# Patient Record
Sex: Male | Born: 1945 | Race: White | Hispanic: No | Marital: Married | Smoking: Former smoker
Health system: Southern US, Community
[De-identification: ages and names within clinical notes are randomized; demographics above are authoritative.]

## PROBLEM LIST (undated history)

## (undated) DIAGNOSIS — K635 Polyp of colon: Secondary | ICD-10-CM

## (undated) DIAGNOSIS — L409 Psoriasis, unspecified: Secondary | ICD-10-CM

## (undated) DIAGNOSIS — I420 Dilated cardiomyopathy: Secondary | ICD-10-CM

## (undated) DIAGNOSIS — I480 Paroxysmal atrial fibrillation: Secondary | ICD-10-CM

## (undated) DIAGNOSIS — Z7901 Long term (current) use of anticoagulants: Secondary | ICD-10-CM

## (undated) DIAGNOSIS — F17201 Nicotine dependence, unspecified, in remission: Secondary | ICD-10-CM

## (undated) DIAGNOSIS — I10 Essential (primary) hypertension: Secondary | ICD-10-CM

## (undated) DIAGNOSIS — K2970 Gastritis, unspecified, without bleeding: Secondary | ICD-10-CM

## (undated) DIAGNOSIS — T783XXA Angioneurotic edema, initial encounter: Secondary | ICD-10-CM

## (undated) HISTORY — DX: Dilated cardiomyopathy: I42.0

## (undated) HISTORY — DX: Nicotine dependence, unspecified, in remission: F17.201

## (undated) HISTORY — DX: Long term (current) use of anticoagulants: Z79.01

## (undated) HISTORY — DX: Paroxysmal atrial fibrillation: I48.0

## (undated) HISTORY — DX: Gastritis, unspecified, without bleeding: K29.70

## (undated) HISTORY — DX: Polyp of colon: K63.5

## (undated) HISTORY — DX: Angioneurotic edema, initial encounter: T78.3XXA

## (undated) HISTORY — DX: Essential (primary) hypertension: I10

## (undated) HISTORY — DX: Psoriasis, unspecified: L40.9

---

## 2002-08-02 ENCOUNTER — Encounter: Payer: Self-pay | Admitting: Family Medicine

## 2002-08-02 ENCOUNTER — Ambulatory Visit (HOSPITAL_COMMUNITY): Admission: RE | Admit: 2002-08-02 | Discharge: 2002-08-02 | Payer: Self-pay | Admitting: Family Medicine

## 2002-08-30 ENCOUNTER — Ambulatory Visit (HOSPITAL_COMMUNITY): Admission: RE | Admit: 2002-08-30 | Discharge: 2002-08-30 | Payer: Self-pay | Admitting: Internal Medicine

## 2003-11-04 ENCOUNTER — Inpatient Hospital Stay (HOSPITAL_COMMUNITY): Admission: EM | Admit: 2003-11-04 | Discharge: 2003-11-05 | Payer: Self-pay | Admitting: *Deleted

## 2003-11-07 ENCOUNTER — Ambulatory Visit (HOSPITAL_COMMUNITY): Admission: RE | Admit: 2003-11-07 | Discharge: 2003-11-07 | Payer: Self-pay | Admitting: *Deleted

## 2003-11-21 ENCOUNTER — Ambulatory Visit: Payer: Self-pay

## 2005-01-11 ENCOUNTER — Emergency Department (HOSPITAL_COMMUNITY): Admission: EM | Admit: 2005-01-11 | Discharge: 2005-01-11 | Payer: Self-pay | Admitting: Emergency Medicine

## 2005-01-12 DIAGNOSIS — I480 Paroxysmal atrial fibrillation: Secondary | ICD-10-CM

## 2005-01-12 HISTORY — DX: Paroxysmal atrial fibrillation: I48.0

## 2005-04-23 ENCOUNTER — Ambulatory Visit: Payer: Self-pay | Admitting: Cardiology

## 2005-04-23 ENCOUNTER — Inpatient Hospital Stay (HOSPITAL_COMMUNITY): Admission: EM | Admit: 2005-04-23 | Discharge: 2005-04-28 | Payer: Self-pay | Admitting: Emergency Medicine

## 2005-04-27 ENCOUNTER — Encounter: Payer: Self-pay | Admitting: Cardiology

## 2005-04-29 ENCOUNTER — Ambulatory Visit: Payer: Self-pay | Admitting: *Deleted

## 2005-05-06 ENCOUNTER — Ambulatory Visit: Payer: Self-pay | Admitting: *Deleted

## 2005-05-11 ENCOUNTER — Ambulatory Visit: Payer: Self-pay | Admitting: Cardiology

## 2005-05-13 ENCOUNTER — Ambulatory Visit: Payer: Self-pay | Admitting: Cardiology

## 2005-05-15 ENCOUNTER — Ambulatory Visit (HOSPITAL_COMMUNITY): Admission: RE | Admit: 2005-05-15 | Discharge: 2005-05-16 | Payer: Self-pay | Admitting: Internal Medicine

## 2005-05-15 ENCOUNTER — Ambulatory Visit: Payer: Self-pay | Admitting: Internal Medicine

## 2005-05-18 ENCOUNTER — Ambulatory Visit: Payer: Self-pay | Admitting: Cardiology

## 2005-05-29 ENCOUNTER — Ambulatory Visit: Payer: Self-pay | Admitting: *Deleted

## 2005-06-01 ENCOUNTER — Ambulatory Visit: Payer: Self-pay | Admitting: *Deleted

## 2005-06-09 ENCOUNTER — Ambulatory Visit: Payer: Self-pay | Admitting: *Deleted

## 2005-06-12 ENCOUNTER — Ambulatory Visit (HOSPITAL_COMMUNITY): Admission: RE | Admit: 2005-06-12 | Discharge: 2005-06-12 | Payer: Self-pay | Admitting: General Surgery

## 2005-06-12 HISTORY — PX: INGUINAL HERNIA REPAIR: SHX194

## 2005-06-17 ENCOUNTER — Ambulatory Visit (HOSPITAL_COMMUNITY): Admission: RE | Admit: 2005-06-17 | Discharge: 2005-06-17 | Payer: Self-pay | Admitting: General Surgery

## 2005-06-29 ENCOUNTER — Ambulatory Visit: Payer: Self-pay | Admitting: Cardiology

## 2005-07-14 ENCOUNTER — Ambulatory Visit (HOSPITAL_COMMUNITY): Admission: RE | Admit: 2005-07-14 | Discharge: 2005-07-14 | Payer: Self-pay | Admitting: Cardiology

## 2005-07-14 ENCOUNTER — Ambulatory Visit: Payer: Self-pay | Admitting: *Deleted

## 2005-07-31 ENCOUNTER — Ambulatory Visit: Payer: Self-pay | Admitting: *Deleted

## 2005-09-25 ENCOUNTER — Ambulatory Visit: Payer: Self-pay | Admitting: Cardiology

## 2007-01-13 HISTORY — PX: COLONOSCOPY W/ POLYPECTOMY: SHX1380

## 2007-05-11 ENCOUNTER — Observation Stay (HOSPITAL_COMMUNITY): Admission: EM | Admit: 2007-05-11 | Discharge: 2007-05-12 | Payer: Self-pay | Admitting: Emergency Medicine

## 2007-12-17 ENCOUNTER — Emergency Department (HOSPITAL_COMMUNITY): Admission: EM | Admit: 2007-12-17 | Discharge: 2007-12-17 | Payer: Self-pay | Admitting: Emergency Medicine

## 2008-09-18 DIAGNOSIS — D126 Benign neoplasm of colon, unspecified: Secondary | ICD-10-CM

## 2009-07-19 ENCOUNTER — Ambulatory Visit (HOSPITAL_COMMUNITY): Admission: RE | Admit: 2009-07-19 | Discharge: 2009-07-19 | Payer: Self-pay | Admitting: Family Medicine

## 2009-07-21 ENCOUNTER — Inpatient Hospital Stay (HOSPITAL_COMMUNITY): Admission: EM | Admit: 2009-07-21 | Discharge: 2009-08-03 | Payer: Self-pay | Admitting: Emergency Medicine

## 2009-07-21 ENCOUNTER — Ambulatory Visit: Payer: Self-pay | Admitting: Cardiology

## 2009-07-22 ENCOUNTER — Encounter (INDEPENDENT_AMBULATORY_CARE_PROVIDER_SITE_OTHER): Payer: Self-pay | Admitting: *Deleted

## 2009-07-22 LAB — CONVERTED CEMR LAB: TSH: 4.3 microintl units/mL

## 2009-07-23 ENCOUNTER — Encounter: Payer: Self-pay | Admitting: Cardiology

## 2009-07-26 ENCOUNTER — Ambulatory Visit: Payer: Self-pay | Admitting: Cardiology

## 2009-07-26 ENCOUNTER — Other Ambulatory Visit: Payer: Self-pay

## 2009-07-27 ENCOUNTER — Other Ambulatory Visit: Payer: Self-pay | Admitting: Cardiology

## 2009-07-28 ENCOUNTER — Other Ambulatory Visit: Payer: Self-pay | Admitting: Cardiology

## 2009-07-29 ENCOUNTER — Other Ambulatory Visit: Payer: Self-pay | Admitting: Cardiology

## 2009-07-30 ENCOUNTER — Other Ambulatory Visit: Payer: Self-pay | Admitting: Cardiology

## 2009-07-30 ENCOUNTER — Encounter: Payer: Self-pay | Admitting: Cardiology

## 2009-07-31 ENCOUNTER — Other Ambulatory Visit: Payer: Self-pay | Admitting: Cardiology

## 2009-08-01 ENCOUNTER — Other Ambulatory Visit: Payer: Self-pay | Admitting: Cardiology

## 2009-08-01 ENCOUNTER — Encounter (INDEPENDENT_AMBULATORY_CARE_PROVIDER_SITE_OTHER): Payer: Self-pay | Admitting: *Deleted

## 2009-08-01 LAB — CONVERTED CEMR LAB
HCT: 41.7 %
Hemoglobin: 13.9 g/dL
INR: 1.9
MCHC: 33.4 g/dL
MCV: 92.2 fL
Platelets: 225 10*3/uL
Prothrombin Time: 21.6 s
RBC: 4.53 M/uL
RDW: 15.1 %
WBC: 9 10*3/uL

## 2009-08-02 ENCOUNTER — Other Ambulatory Visit: Payer: Self-pay | Admitting: Cardiology

## 2009-08-02 ENCOUNTER — Encounter (INDEPENDENT_AMBULATORY_CARE_PROVIDER_SITE_OTHER): Payer: Self-pay | Admitting: *Deleted

## 2009-08-02 ENCOUNTER — Other Ambulatory Visit: Payer: Self-pay | Admitting: Internal Medicine

## 2009-08-02 LAB — CONVERTED CEMR LAB
Cholesterol: 169 mg/dL
HCT: 42 %
HDL: 43 mg/dL
Hemoglobin: 14.1 g/dL
INR: 2.64
LDL Cholesterol: 110 mg/dL
MCHC: 33.5 g/dL
MCV: 92.3 fL
Platelets: 218 10*3/uL
Prothrombin Time: 28 s
RBC: 4.55 M/uL
RDW: 14.7 %
Triglycerides: 78 mg/dL
WBC: 9.6 10*3/uL

## 2009-08-03 ENCOUNTER — Other Ambulatory Visit: Payer: Self-pay | Admitting: Cardiology

## 2009-08-03 ENCOUNTER — Encounter (INDEPENDENT_AMBULATORY_CARE_PROVIDER_SITE_OTHER): Payer: Self-pay | Admitting: *Deleted

## 2009-08-03 LAB — CONVERTED CEMR LAB
HCT: 40.4 %
Hemoglobin: 13.5 g/dL
INR: 2.96
MCHC: 33.4 g/dL
MCV: 92.7 fL
Platelets: 214 10*3/uL
Prothrombin Time: 30.6 s
RBC: 4.36 M/uL
RDW: 14.9 %
WBC: 9.6 10*3/uL

## 2009-08-07 ENCOUNTER — Ambulatory Visit: Payer: Self-pay | Admitting: Cardiology

## 2009-08-07 LAB — CONVERTED CEMR LAB: POC INR: 1.2

## 2009-08-12 ENCOUNTER — Ambulatory Visit: Payer: Self-pay | Admitting: Cardiology

## 2009-08-12 LAB — CONVERTED CEMR LAB: POC INR: 3.3

## 2009-08-21 ENCOUNTER — Ambulatory Visit: Payer: Self-pay | Admitting: Cardiology

## 2009-08-21 ENCOUNTER — Encounter (INDEPENDENT_AMBULATORY_CARE_PROVIDER_SITE_OTHER): Payer: Self-pay | Admitting: *Deleted

## 2009-08-21 LAB — CONVERTED CEMR LAB: POC INR: 3.7

## 2009-08-27 ENCOUNTER — Ambulatory Visit: Payer: Self-pay | Admitting: Cardiology

## 2009-08-27 ENCOUNTER — Encounter: Payer: Self-pay | Admitting: Adult Health

## 2009-08-27 ENCOUNTER — Encounter (INDEPENDENT_AMBULATORY_CARE_PROVIDER_SITE_OTHER): Payer: Self-pay | Admitting: *Deleted

## 2009-08-29 ENCOUNTER — Ambulatory Visit: Payer: Self-pay | Admitting: Cardiology

## 2009-08-29 LAB — CONVERTED CEMR LAB: POC INR: 1.2

## 2009-09-02 ENCOUNTER — Ambulatory Visit: Payer: Self-pay | Admitting: Cardiology

## 2009-09-02 LAB — CONVERTED CEMR LAB: POC INR: 1.9

## 2009-09-05 ENCOUNTER — Ambulatory Visit: Payer: Self-pay | Admitting: Cardiology

## 2009-09-05 LAB — CONVERTED CEMR LAB: POC INR: 2.7

## 2009-09-06 ENCOUNTER — Telehealth (INDEPENDENT_AMBULATORY_CARE_PROVIDER_SITE_OTHER): Payer: Self-pay | Admitting: *Deleted

## 2009-09-13 ENCOUNTER — Ambulatory Visit: Payer: Self-pay | Admitting: Cardiology

## 2009-09-13 LAB — CONVERTED CEMR LAB: POC INR: 4.2

## 2009-09-20 ENCOUNTER — Ambulatory Visit: Payer: Self-pay | Admitting: Cardiology

## 2009-09-20 LAB — CONVERTED CEMR LAB: POC INR: 2.9

## 2009-09-27 ENCOUNTER — Ambulatory Visit: Payer: Self-pay | Admitting: Cardiology

## 2009-09-27 LAB — CONVERTED CEMR LAB: POC INR: 2.5

## 2009-10-11 ENCOUNTER — Ambulatory Visit: Payer: Self-pay | Admitting: Cardiology

## 2009-10-11 LAB — CONVERTED CEMR LAB: POC INR: 2.9

## 2009-11-08 ENCOUNTER — Ambulatory Visit: Payer: Self-pay | Admitting: Cardiology

## 2009-11-08 LAB — CONVERTED CEMR LAB: POC INR: 2

## 2009-11-18 ENCOUNTER — Encounter (INDEPENDENT_AMBULATORY_CARE_PROVIDER_SITE_OTHER): Payer: Self-pay | Admitting: *Deleted

## 2009-11-22 ENCOUNTER — Encounter: Payer: Self-pay | Admitting: Cardiology

## 2009-11-22 ENCOUNTER — Ambulatory Visit: Payer: Self-pay | Admitting: Cardiology

## 2009-11-22 ENCOUNTER — Ambulatory Visit (HOSPITAL_COMMUNITY): Admission: RE | Admit: 2009-11-22 | Discharge: 2009-11-22 | Payer: Self-pay | Admitting: Cardiology

## 2009-11-25 ENCOUNTER — Ambulatory Visit: Payer: Self-pay | Admitting: Cardiology

## 2009-11-25 DIAGNOSIS — R000 Tachycardia, unspecified: Secondary | ICD-10-CM

## 2009-11-25 DIAGNOSIS — L408 Other psoriasis: Secondary | ICD-10-CM

## 2009-11-25 DIAGNOSIS — T783XXA Angioneurotic edema, initial encounter: Secondary | ICD-10-CM | POA: Insufficient documentation

## 2009-11-25 DIAGNOSIS — I43 Cardiomyopathy in diseases classified elsewhere: Secondary | ICD-10-CM

## 2009-11-25 DIAGNOSIS — K279 Peptic ulcer, site unspecified, unspecified as acute or chronic, without hemorrhage or perforation: Secondary | ICD-10-CM | POA: Insufficient documentation

## 2009-11-25 LAB — CONVERTED CEMR LAB: POC INR: 2.6

## 2009-11-26 ENCOUNTER — Encounter: Payer: Self-pay | Admitting: Cardiology

## 2009-12-27 ENCOUNTER — Ambulatory Visit: Payer: Self-pay | Admitting: Cardiology

## 2009-12-27 LAB — CONVERTED CEMR LAB: POC INR: 1.5

## 2010-01-09 ENCOUNTER — Encounter (INDEPENDENT_AMBULATORY_CARE_PROVIDER_SITE_OTHER): Payer: Self-pay | Admitting: *Deleted

## 2010-01-09 ENCOUNTER — Encounter: Payer: Self-pay | Admitting: Adult Health

## 2010-01-09 ENCOUNTER — Ambulatory Visit (HOSPITAL_COMMUNITY)
Admission: RE | Admit: 2010-01-09 | Discharge: 2010-01-09 | Payer: Self-pay | Source: Home / Self Care | Attending: Internal Medicine | Admitting: Internal Medicine

## 2010-01-09 ENCOUNTER — Ambulatory Visit: Payer: Self-pay | Admitting: Cardiology

## 2010-01-09 LAB — CONVERTED CEMR LAB
ALT: 17 units/L
AST: 17 units/L
Albumin: 3.8 g/dL
Alkaline Phosphatase: 6.7 units/L
BUN: 17 mg/dL
Basophils Absolute: 0.1 10*3/uL
Basophils Relative: 1 %
CO2: 27 meq/L
Calcium: 8.7 mg/dL
Chloride: 104 meq/L
Creatinine, Ser: 1.29 mg/dL
Eosinophils Absolute: 0.1 10*3/uL
Eosinophils Relative: 1 %
GFR calc non Af Amer: 79 mL/min
Glucose, Bld: 87 mg/dL
HCT: 41 %
Hemoglobin: 12.7 g/dL
Lymphocytes Relative: 24 %
Lymphs Abs: 1.8 10*3/uL
MCHC: 31 g/dL
MCV: 99 fL
Monocytes Absolute: 0.8 10*3/uL
Monocytes Relative: 11 %
POC INR: 2.4
Platelets: 305 10*3/uL
Potassium: 4.6 meq/L
RBC: 4.14 M/uL
RDW: 13 %
Sodium: 140 meq/L
TSH: 13.351 microintl units/mL
Total Protein: 6.7 g/dL
WBC: 7.8 10*3/uL

## 2010-01-10 ENCOUNTER — Encounter: Payer: Self-pay | Admitting: Adult Health

## 2010-01-10 ENCOUNTER — Ambulatory Visit: Payer: Self-pay | Admitting: Cardiology

## 2010-01-14 LAB — CONVERTED CEMR LAB
ALT: 17 units/L (ref 0–53)
AST: 17 units/L (ref 0–37)
Albumin: 3.8 g/dL (ref 3.5–5.2)
Alkaline Phosphatase: 76 units/L (ref 39–117)
BUN: 17 mg/dL (ref 6–23)
Basophils Absolute: 0.1 10*3/uL (ref 0.0–0.1)
Basophils Relative: 1 % (ref 0–1)
CO2: 27 meq/L (ref 19–32)
Calcium: 8.7 mg/dL (ref 8.4–10.5)
Chloride: 104 meq/L (ref 96–112)
Creatinine, Ser: 1.29 mg/dL (ref 0.40–1.50)
Eosinophils Absolute: 0.1 10*3/uL (ref 0.0–0.7)
Eosinophils Relative: 1 % (ref 0–5)
Glucose, Bld: 87 mg/dL (ref 70–99)
HCT: 41 % (ref 39.0–52.0)
Hemoglobin: 12.7 g/dL — ABNORMAL LOW (ref 13.0–17.0)
Lymphocytes Relative: 24 % (ref 12–46)
Lymphs Abs: 1.8 10*3/uL (ref 0.7–4.0)
MCHC: 31 g/dL (ref 30.0–36.0)
MCV: 99 fL (ref 78.0–100.0)
Monocytes Absolute: 0.8 10*3/uL (ref 0.1–1.0)
Monocytes Relative: 11 % (ref 3–12)
Neutro Abs: 5 10*3/uL (ref 1.7–7.7)
Neutrophils Relative %: 64 % (ref 43–77)
Platelets: 305 10*3/uL (ref 150–400)
Potassium: 4.6 meq/L (ref 3.5–5.3)
RBC: 4.14 M/uL — ABNORMAL LOW (ref 4.22–5.81)
RDW: 13 % (ref 11.5–15.5)
Sodium: 140 meq/L (ref 135–145)
TSH: 13.351 microintl units/mL — ABNORMAL HIGH (ref 0.350–4.500)
Total Bilirubin: 0.3 mg/dL (ref 0.3–1.2)
Total Protein: 6.7 g/dL (ref 6.0–8.3)
WBC: 7.8 10*3/uL (ref 4.0–10.5)

## 2010-01-16 ENCOUNTER — Encounter (INDEPENDENT_AMBULATORY_CARE_PROVIDER_SITE_OTHER): Payer: Self-pay | Admitting: *Deleted

## 2010-01-17 ENCOUNTER — Encounter (INDEPENDENT_AMBULATORY_CARE_PROVIDER_SITE_OTHER): Payer: Self-pay | Admitting: *Deleted

## 2010-01-17 ENCOUNTER — Ambulatory Visit
Admission: RE | Admit: 2010-01-17 | Discharge: 2010-01-17 | Payer: Self-pay | Source: Home / Self Care | Attending: Cardiology | Admitting: Cardiology

## 2010-01-17 DIAGNOSIS — E039 Hypothyroidism, unspecified: Secondary | ICD-10-CM | POA: Insufficient documentation

## 2010-01-17 DIAGNOSIS — R0602 Shortness of breath: Secondary | ICD-10-CM | POA: Insufficient documentation

## 2010-01-20 ENCOUNTER — Encounter: Payer: Self-pay | Admitting: Cardiology

## 2010-01-21 ENCOUNTER — Ambulatory Visit (HOSPITAL_COMMUNITY)
Admission: RE | Admit: 2010-01-21 | Discharge: 2010-01-21 | Payer: Self-pay | Source: Home / Self Care | Attending: Cardiology | Admitting: Cardiology

## 2010-01-27 LAB — BLOOD GAS, ARTERIAL
Acid-base deficit: 0.3 mmol/L (ref 0.0–2.0)
Bicarbonate: 23.5 mEq/L (ref 20.0–24.0)
FIO2: 0.21 %
O2 Saturation: 93.7 %
Patient temperature: 37
TCO2: 21.2 mmol/L (ref 0–100)
pCO2 arterial: 36.4 mmHg (ref 35.0–45.0)
pH, Arterial: 7.427 (ref 7.350–7.450)
pO2, Arterial: 72.5 mmHg — ABNORMAL LOW (ref 80.0–100.0)

## 2010-01-30 ENCOUNTER — Telehealth (INDEPENDENT_AMBULATORY_CARE_PROVIDER_SITE_OTHER): Payer: Self-pay | Admitting: *Deleted

## 2010-01-30 ENCOUNTER — Encounter: Payer: Self-pay | Admitting: Cardiology

## 2010-02-07 ENCOUNTER — Ambulatory Visit: Admission: RE | Admit: 2010-02-07 | Discharge: 2010-02-07 | Payer: Self-pay | Source: Home / Self Care

## 2010-02-07 LAB — CONVERTED CEMR LAB: POC INR: 1.7

## 2010-02-11 NOTE — Miscellaneous (Signed)
Summary: HOSPITAL LABS 08/01/2009-08/03/2009  Clinical Lists Changes  Observations: Added new observation of INR: 2.96  (08/03/2009 9:49) Added new observation of PT PATIENT: 30.6 s (08/03/2009 9:49) Added new observation of PLATELETK/UL: 214 K/uL (08/03/2009 9:49) Added new observation of RDW: 14.9 % (08/03/2009 9:49) Added new observation of MCHC RBC: 33.4 g/dL (24/40/1027 2:53) Added new observation of MCV: 92.7 fL (08/03/2009 9:49) Added new observation of HCT: 40.4 % (08/03/2009 9:49) Added new observation of HGB: 13.5 g/dL (66/44/0347 4:25) Added new observation of RBC M/UL: 4.36 M/uL (08/03/2009 9:49) Added new observation of WBC COUNT: 9.6 10*3/microliter (08/03/2009 9:49) Added new observation of LDL: 110 mg/dL (95/63/8756 4:33) Added new observation of HDL: 43 mg/dL (29/51/8841 6:60) Added new observation of TRIGLYC TOT: 78 mg/dL (63/01/6008 9:32) Added new observation of CHOLESTEROL: 169 mg/dL (35/57/3220 2:54) Added new observation of INR: 2.64  (08/02/2009 9:49) Added new observation of PT PATIENT: 28.0 s (08/02/2009 9:49) Added new observation of PLATELETK/UL: 218 K/uL (08/02/2009 9:49) Added new observation of RDW: 14.7 % (08/02/2009 9:49) Added new observation of MCHC RBC: 33.5 g/dL (27/06/2374 2:83) Added new observation of MCV: 92.3 fL (08/02/2009 9:49) Added new observation of HCT: 42.0 % (08/02/2009 9:49) Added new observation of HGB: 14.1 g/dL (15/17/6160 7:37) Added new observation of RBC M/UL: 4.55 M/uL (08/02/2009 9:49) Added new observation of WBC COUNT: 9.6 10*3/microliter (08/02/2009 9:49) Added new observation of INR: 1.90  (08/01/2009 9:49) Added new observation of PT PATIENT: 21.6 s (08/01/2009 9:49) Added new observation of PLATELETK/UL: 225 K/uL (08/01/2009 9:49) Added new observation of RDW: 15.1 % (08/01/2009 9:49) Added new observation of MCHC RBC: 33.4 g/dL (10/62/6948 5:46) Added new observation of MCV: 92.2 fL (08/01/2009 9:49) Added new  observation of HCT: 41.7 % (08/01/2009 9:49) Added new observation of HGB: 13.9 g/dL (27/03/5007 3:81) Added new observation of RBC M/UL: 4.53 M/uL (08/01/2009 9:49) Added new observation of WBC COUNT: 9.0 10*3/microliter (08/01/2009 9:49)

## 2010-02-11 NOTE — Medication Information (Signed)
Summary: ccr-lr  Anticoagulant Therapy  Managed by: Vashti Hey, RN PCP: Dr.Fusco Supervising MD: Myrtis Ser MD, Tinnie Gens Indication 1: Atrial Fibrillation Lab Used: [facility] [place] Site: [place] INR POC 2.9  Dietary changes: no    Health status changes: no    Bleeding/hemorrhagic complications: no    Recent/future hospitalizations: no    Any changes in medication regimen? no    Recent/future dental: no  Any missed doses?: no       Is patient compliant with meds? yes       Allergies: No Known Drug Allergies  Anticoagulation Management History:      The patient is taking warfarin and comes in today for a routine follow up visit.  Negative risk factors for bleeding include an age less than 14 years old.  The bleeding index is 'low risk'.  Negative CHADS2 values include Age > 53 years old.  His last INR was 2.96.  Anticoagulation responsible provider: Myrtis Ser MD, Tinnie Gens.  INR POC: 2.9.  Cuvette Lot#: [id number].    Anticoagulation Management Assessment/Plan:      The patient's current anticoagulation dose is Warfarin sodium 2.5 mg tabs: Use as directed by Anticoagualtion Clinic.  The target INR is 2.0-3.0.  The next INR is due 09/27/2009.  Anticoagulation instructions were given to spouse.  Results were reviewed/authorized by Vashti Hey, RN.  He was notified by Vashti Hey RN.         Prior Anticoagulation Instructions: INR 4.2 Hold coumadin tonight then decrease dose to 2.5mg  once daily except 1.25mg  on T,Th,Sat  Current Anticoagulation Instructions: INR 2.9 Continue coumadin 2.5mg  once daily except 1.25mg  on T,Th, Sat

## 2010-02-11 NOTE — Medication Information (Signed)
Summary: ccr-lr  Anticoagulant Therapy  Managed by: Vashti Hey, RN Supervising MD: Diona Browner MD, Remi Deter Indication 1: Atrial Fibrillation Lab Used: [facility] [place] Site: [place] INR POC 3.7  Dietary changes: no    Health status changes: no    Bleeding/hemorrhagic complications: no    Recent/future hospitalizations: no    Any changes in medication regimen? no    Recent/future dental: no  Any missed doses?: no       Is patient compliant with meds? yes       Allergies: No Known Drug Allergies  Anticoagulation Management History:      The patient is taking warfarin and comes in today for a routine follow up visit.  Negative risk factors for bleeding include an age less than 71 years old.  The bleeding index is 'low risk'.  Negative CHADS2 values include Age > 2 years old.  His last INR was 2.96.  Anticoagulation responsible provider: Diona Browner MD, Remi Deter.  INR POC: 3.7.  Cuvette Lot#: [id number].    Anticoagulation Management Assessment/Plan:      The patient's current anticoagulation dose is Warfarin sodium 2.5 mg tabs: Use as directed by Anticoagualtion Clinic.  The target INR is 2.0-3.0.  The next INR is due 09/02/2009.  Anticoagulation instructions were given to spouse.  Results were reviewed/authorized by Vashti Hey, RN.  He was notified by Vashti Hey RN.         Prior Anticoagulation Instructions: INR 3.3 Hold coumadin tonight then resume 2.5mg  once daily   Current Anticoagulation Instructions: INR 3.7 Hold coumadin tonight then decrease dose to 2.5mg  once daily except 1.25mg  on Mondays, Wednesdays and Fridays

## 2010-02-11 NOTE — Medication Information (Signed)
Summary: ccr-lr  Anticoagulant Therapy  Managed by: Vashti Hey, RN PCP: Dr.Fusco Supervising MD: Myrtis Ser MD, Tinnie Gens Indication 1: Atrial Fibrillation Lab Used: [facility] [place] Site: [place] INR POC 2.0  Dietary changes: no    Health status changes: no    Bleeding/hemorrhagic complications: no    Recent/future hospitalizations: no    Any changes in medication regimen? no    Recent/future dental: no  Any missed doses?: no       Is patient compliant with meds? yes       Allergies: No Known Drug Allergies  Anticoagulation Management History:      The patient is taking warfarin and comes in today for a routine follow up visit.  Negative risk factors for bleeding include an age less than 4 years old.  The bleeding index is 'low risk'.  Negative CHADS2 values include Age > 62 years old.  His last INR was 2.96.  Anticoagulation responsible provider: Myrtis Ser MD, Tinnie Gens.  INR POC: 2.0.  Cuvette Lot#: [id number].    Anticoagulation Management Assessment/Plan:      The patient's current anticoagulation dose is Warfarin sodium 2.5 mg tabs: Use as directed by Anticoagualtion Clinic.  The target INR is 2.0-3.0.  The next INR is due 12/03/2009.  Anticoagulation instructions were given to spouse.  Results were reviewed/authorized by Vashti Hey, RN.  He was notified by Vashti Hey RN.         Prior Anticoagulation Instructions: INR 2.9 Continue coumadin 2.5mg  once daily except 1.25mg  on T,Th,Sat  Current Anticoagulation Instructions: INR 2.0 Continue coumadin 2.5mg  once daily except 1.25mg  on T,Th,Sat

## 2010-02-11 NOTE — Miscellaneous (Signed)
Summary: labs tsh 07/22/2009  Clinical Lists Changes  Observations: Added new observation of TSH: 4.300 microintl units/mL (07/22/2009 11:59)

## 2010-02-11 NOTE — Assessment & Plan Note (Signed)
Summary: post hosp [facility] per hosp. card/tg   Visit Type:  Follow-up Primary Provider:  Dr.Fusco  CC:  edema in feet started last night.  History of Present Illness: [name] Hunter is a pleasant 65 y/o CM we are seeing on follow-up after recent hospitalization for cardiac catherization after transfer from [facility] to [facility] in July 2011.  He was intially found to be in atrial flutter and an echo demonstrated EF of 20-25%.  This was a change as his former EF was WNL.  Cardiac catherization demonstrated minimal CAD.  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 A TEE cardioversion was completed and he was converted to NSR.  He was sent home on coumadin, digoxin, and amioderone 200mg  two times a day, along with lopressor.  He is followed in our coumadin clinic in [place].  He has quit smoking since discharge.  He has had no complaints of bleeding, DOE, chest discomfort or palpatations.  He is back in Atrial fib per EKG today.  His HR is controlled.  He has no complaints of edema or PND.  Preventive Screening-Counseling & Management  Alcohol-Tobacco     Smoking Status: quit < 6 months  Current Medications (verified): 1)  Warfarin Sodium 2.5 Mg Tabs (Warfarin Sodium) .... Use As Directed By Anticoagualtion Clinic 2)  Pacerone 200 Mg Tabs (Amiodarone Hcl) .... Take 1 Tab Two Times A Day 3)  Digoxin 0.125 Mg Tabs (Digoxin) .... Take 1 Tab Daily 4)  Metoprolol Tartrate 50 Mg Tabs (Metoprolol Tartrate) .... Take 1 Tab Two Times A Day 5)  Crestor 5 Mg Tabs (Rosuvastatin Calcium) .... Take 1 Tab Daily 6)  Lisinopril 5 Mg Tabs (Lisinopril) .... Take 1 Tab Daily  Allergies (verified): No Known Drug Allergies  Past History:  Past medical, surgical, family and social histories (including risk factors) reviewed, and no changes noted (except as noted below).  Past Medical History: Reviewed history from 09/18/2008 and no changes required. Current Problems:  COLONIC POLYPS (ICD-211.3) PUD  (ICD-533.90) PAROXYSMAL ATRIAL FIBRILLATION (ICD-427.31)  Past Surgical History: Reviewed history from 09/18/2008 and no changes required. ablation of inducable fis/flutter RFA 5/07 [facility] 6/07  Family History: Reviewed history from 09/18/2008 and no changes required. Father: Mother: Siblings:  Social History: Full Time Married  Tobacco Use - Quit times one month. Alcohol Use - no Regular Exercise - no Drug Use - no Smoking Status:  quit < 6 months  Review of Systems       All other systems have been reviewed and are negative unless stated above.    Vital Signs:  Patient profile:   65 year old male Height:      69 inches Weight:      181 pounds BMI:     26.83 Pulse rate:   69 / minute BP sitting:   127 / 64  (right arm)  Vitals Entered By: [name], CNA (August 27, 2009 1:00 PM)  Physical Exam  General:  Well developed, well nourished, in no acute distress. Head:  normocephalic and atraumatic Mouth:  Teeth, gums and palate normal. Oral mucosa normal. Lungs:  Clear bilaterally to auscultation and percussion. Heart:  Irregularly regular.  No MRG, Abdomen:  Bowel sounds positive; abdomen soft and non-tender without masses, organomegaly, or hernias noted. No hepatosplenomegaly. Msk:  Back normal, normal gait. Muscle strength and tone normal. Extremities:  No clubbing or cyanosis. Neurologic:  Alert and oriented x 3. Psych:  Normal affect.   Impression & Recommendations:  Problem #  1:  ATRIAL FIBRILLATION (ICD-427.31) He is tolerating his HR and it is well controlled on Digoxin, Amioderone, and Lopressor.  He denies tachypalpatations.  His energy level is increasing and he is anxious to return to work.  He works in [employer] but does not overly  exert himself.  He will continue to follow up with coumadin clinic.  Consideration of repeat DCCV may be discussed one he is therapeutic for a minimum of 6wks to 3 months.  He isi advised on safety precautions  concerning work activities on coumadin. Will plan on TSH level in 6 weeks on amioderone. His updated medication list for this problem includes:    Warfarin Sodium 2.5 Mg Tabs (Warfarin sodium) ..... Use as directed by anticoagualtion clinic    Pacerone 200 Mg Tabs (Amiodarone hcl) .Marland Kitchen... Take 1 tab two times a day    Digoxin 0.125 Mg Tabs (Digoxin) .Marland Kitchen... Take 1 tab daily    Metoprolol Tartrate 50 Mg Tabs (Metoprolol tartrate) .Marland Kitchen... Take 1 tab two times a day  Problem # 2:  CARDIOMYOPATHY, SECONDARY (ICD-425.9) He continues to improve and denies DOE.  He remains active, but does tire on occasion. His wife who is with him states that he is not very active.  He has quit smoking for one month and he was congratulated on this, and encouraged to continue this behavior.  Will repeat echo in 3 months to evaluate EF after medical management.  Will not put on duiretic at this time, unless he become symptomatic. His updated medication list for this problem includes:    Warfarin Sodium 2.5 Mg Tabs (Warfarin sodium) ..... Use as directed by anticoagualtion clinic    Pacerone 200 Mg Tabs (Amiodarone hcl) .Marland Kitchen... Take 1 tab two times a day    Digoxin 0.125 Mg Tabs (Digoxin) .Marland Kitchen... Take 1 tab daily    Metoprolol Tartrate 50 Mg Tabs (Metoprolol tartrate) .Marland Kitchen... Take 1 tab two times a day    Lisinopril 5 Mg Tabs (Lisinopril) .Marland Kitchen... Take 1 tab daily  Other Orders: EKG w/ Interpretation (93000) T-TSH (40981-19147) 2-D Echocardiogram (2D Echo)  Patient Instructions: 1)  Your physician recommends that you schedule a follow-up appointment in: with [facility]. 2)  Your physician recommends that you continue on your current medications as directed. Please refer to the Current Medication list given to you today. 3)  Your physician recommends that you return for lab work in:6 weeks have TSH at [facility]. 4)  Your physician has requested that you have an echocardiogram in just before your next appointment.   Echocardiography is a painless test that uses sound waves to create images of your heart. It provides your doctor with information about the size and shape of your heart and how well your heart's chambers and valves are working.  This procedure takes approximately one hour. There are no restrictions for this procedure. 5)  You have been provided with a return to work note for 09/09/09.

## 2010-02-11 NOTE — Medication Information (Signed)
Summary: ccr-lr  Anticoagulant Therapy  Managed by: Vashti Hey, RN PCP: Dr.Fusco Supervising MD: Daleen Squibb MD, Maisie Fus Indication 1: Atrial Fibrillation Lab Used: [facility] [place] Site: [place] INR POC 2.7  Dietary changes: no    Health status changes: no    Bleeding/hemorrhagic complications: no    Recent/future hospitalizations: no    Any changes in medication regimen? no    Recent/future dental: no  Any missed doses?: no       Is patient compliant with meds? yes       Allergies: No Known Drug Allergies  Anticoagulation Management History:      The patient is taking warfarin and comes in today for a routine follow up visit.  Negative risk factors for bleeding include an age less than 33 years old.  The bleeding index is 'low risk'.  Negative CHADS2 values include Age > 29 years old.  His last INR was 2.96.  Anticoagulation responsible provider: Daleen Squibb MD, Maisie Fus.  INR POC: 2.7.  Cuvette Lot#: [id number].    Anticoagulation Management Assessment/Plan:      The patient's current anticoagulation dose is Warfarin sodium 2.5 mg tabs: Use as directed by Anticoagualtion Clinic.  The target INR is 2.0-3.0.  The next INR is due 09/09/2009.  Anticoagulation instructions were given to spouse.  Results were reviewed/authorized by Vashti Hey, RN.  He was notified by Vashti Hey RN.         Prior Anticoagulation Instructions: INR 1.9 Continue coumadin 2.5mg  once daily except 1.25mg  on Wednesdays  Current Anticoagulation Instructions: INR 2.7 Continue coumadin 2.5mg  once daily except 1.25mg  on Wednesday

## 2010-02-11 NOTE — Medication Information (Signed)
Summary: post hosp [facility] (NEW) per hosp. card/tg  Anticoagulant Therapy  Managed by: Vashti Hey, RN Supervising MD: Dietrich Pates MD, Molly Maduro Indication 1: Atrial Fibrillation Lab Used: [facility] [place] Site: [place] INR POC 1.2  Dietary changes: no    Health status changes: no    Bleeding/hemorrhagic complications: no    Recent/future hospitalizations: yes       Details: In [facility] 7/15 - 7/23 with atrial fib/flutter  Any changes in medication regimen? yes       Details: was to go home on coumadin 2.5mg  qd but was not given Rx at D/c and they did not realize they didn't have that med.  D/C INR 2.96  Recent/future dental: no  Any missed doses?: yes     Details: Has not taken since D/C from hospital due to above  Is patient compliant with meds? yes      Comments: Coumadin teaching provided to pt and wife.  Allergies: No Known Drug Allergies  Anticoagulation Management History:      The patient is taking warfarin and comes in today for a routine follow up visit.  Negative risk factors for bleeding include an age less than 6 years old.  The bleeding index is 'low risk'.  Negative CHADS2 values include Age > 56 years old.  Anticoagulation responsible provider: Dietrich Pates MD, Molly Maduro.  INR POC: 1.2.  Cuvette Lot#: [id number].    Anticoagulation Management Assessment/Plan:      The patient's current anticoagulation dose is Warfarin sodium 2.5 mg tabs: Use as directed by Anticoagualtion Clinic.  The target INR is 2.0-3.0.  The next INR is due 08/12/2009.  Anticoagulation instructions were given to spouse.  Results were reviewed/authorized by Vashti Hey, RN.  He was notified by Vashti Hey RN.         Current Anticoagulation Instructions: INR 1.2 Take coumadin 2 tablets tonight, 1 1/2 tablets tomorrow night then take 1 tablet once daily  Prescriptions: WARFARIN SODIUM 2.5 MG TABS (WARFARIN SODIUM) Use as directed by Anticoagualtion Clinic  #45 x 3   Entered by:   Vashti Hey  RN   Authorized by:   Kathlen Brunswick, MD, [facility]   Signed by:   Vashti Hey RN on 08/07/2009   Method used:   Electronically to        [facility]  [address] (retail)       [address]       [place], [place]  [address]       Ph: [phone]       Fax: [phone]   RxID:   [id number]

## 2010-02-11 NOTE — Medication Information (Signed)
Summary: ccr-lr  Anticoagulant Therapy  Managed by: Vashti Hey, RN Supervising MD: Dietrich Pates MD, Molly Maduro Indication 1: Atrial Fibrillation Lab Used: [facility] [place] Site: [place] INR POC 3.3  Dietary changes: no    Health status changes: no    Bleeding/hemorrhagic complications: no    Recent/future hospitalizations: no    Any changes in medication regimen? no    Recent/future dental: no  Any missed doses?: no       Is patient compliant with meds? yes       Allergies: No Known Drug Allergies  Anticoagulation Management History:      The patient is taking warfarin and comes in today for a routine follow up visit.  Negative risk factors for bleeding include an age less than 45 years old.  The bleeding index is 'low risk'.  Negative CHADS2 values include Age > 52 years old.  Anticoagulation responsible provider: Dietrich Pates MD, Molly Maduro.  INR POC: 3.3.  Cuvette Lot#: [id number].    Anticoagulation Management Assessment/Plan:      The patient's current anticoagulation dose is Warfarin sodium 2.5 mg tabs: Use as directed by Anticoagualtion Clinic.  The target INR is 2.0-3.0.  The next INR is due 08/21/2009.  Anticoagulation instructions were given to spouse.  Results were reviewed/authorized by Vashti Hey, RN.  He was notified by Vashti Hey RN.        Coagulation management information includes: S/P DCCV in hospital.  Prior Anticoagulation Instructions: INR 1.2 Take coumadin 2 tablets tonight, 1 1/2 tablets tomorrow night then take 1 tablet once daily   Current Anticoagulation Instructions: INR 3.3 Hold coumadin tonight then resume 2.5mg  once daily

## 2010-02-11 NOTE — Medication Information (Signed)
Summary: ccr-lr  Anticoagulant Therapy  Managed by: Vashti Hey, RN PCP: Dr.Fusco Supervising MD: Dietrich Pates MD, Molly Maduro Indication 1: Atrial Fibrillation Lab Used: [facility] [place] Site: [place] INR POC 1.2  Dietary changes: no    Health status changes: no    Bleeding/hemorrhagic complications: no    Recent/future hospitalizations: no    Any changes in medication regimen? no    Recent/future dental: no  Any missed doses?: yes     Details: Denies missing doses  Is patient compliant with meds? yes       Allergies: No Known Drug Allergies  Anticoagulation Management History:      The patient is taking warfarin and comes in today for a routine follow up visit.  Negative risk factors for bleeding include an age less than 43 years old.  The bleeding index is 'low risk'.  Negative CHADS2 values include Age > 47 years old.  His last INR was 2.96.  Anticoagulation responsible provider: Dietrich Pates MD, Molly Maduro.  INR POC: 1.2.  Cuvette Lot#: [id number].    Anticoagulation Management Assessment/Plan:      The patient's current anticoagulation dose is Warfarin sodium 2.5 mg tabs: Use as directed by Anticoagualtion Clinic.  The target INR is 2.0-3.0.  The next INR is due 09/02/2009.  Anticoagulation instructions were given to spouse.  Results were reviewed/authorized by Vashti Hey, RN.  He was notified by Vashti Hey RN.         Prior Anticoagulation Instructions: INR 3.7 Hold coumadin tonight then decrease dose to 2.5mg  once daily except 1.25mg  on Mondays, Wednesdays and Fridays  Current Anticoagulation Instructions: INR 1.2 Take couamdin 1 1/2 tablets tonight then increase dose to 1 tablet once daily except 1/2 on Wednesdays

## 2010-02-11 NOTE — Medication Information (Signed)
Summary: [facility]  Anticoagulant Therapy  Managed by: Vashti Hey, RN PCP: Dr.Fusco Supervising MD: Dietrich Pates MD, Molly Maduro Indication 1: Atrial Fibrillation Lab Used: [facility] [place] Site: [place] INR POC 2.6  Dietary changes: no    Health status changes: no    Bleeding/hemorrhagic complications: no    Recent/future hospitalizations: no    Any changes in medication regimen? no    Recent/future dental: no  Any missed doses?: no       Is patient compliant with meds? yes       Allergies: No Known Drug Allergies  Anticoagulation Management History:      The patient is taking warfarin and comes in today for a routine follow up visit.  Negative risk factors for bleeding include an age less than 9 years old.  The bleeding index is 'low risk'.  Negative CHADS2 values include Age > 36 years old.  His last INR was 2.96.  Anticoagulation responsible provider: Dietrich Pates MD, Molly Maduro.  INR POC: 2.6.  Cuvette Lot#: [id number].    Anticoagulation Management Assessment/Plan:      The patient's current anticoagulation dose is Warfarin sodium 2.5 mg tabs: Use as directed by Anticoagualtion Clinic.  The target INR is 2.0-3.0.  The next INR is due 12/27/2009.  Anticoagulation instructions were given to patient.  Results were reviewed/authorized by Vashti Hey, RN.  He was notified by Vashti Hey RN.         Prior Anticoagulation Instructions: INR 2.0 Continue coumadin 2.5mg  once daily except 1.25mg  on T,Th,Sat  Current Anticoagulation Instructions: INR 2.6 Continue coumadin 2.5mg  once daily except 1.25mg  on Tuesdays, Thursdays and Saturdays

## 2010-02-11 NOTE — Medication Information (Signed)
Summary: protime/tg  Anticoagulant Therapy  Managed by: [name] Hey, [name] Hunter PCP: Dr.Fusco Supervising MD: Daleen Squibb MD, Maisie Fus Indication 1: Atrial Fibrillation Lab Used: [facility] [name] Site: [place] INR POC 1.9  Dietary changes: no    Health status changes: no    Bleeding/hemorrhagic complications: no    Recent/future hospitalizations: no    Any changes in medication regimen? no    Recent/future dental: no  Any missed doses?: no       Is patient compliant with meds? yes       Allergies: No Known Drug Allergies  Anticoagulation Management History:      The patient is taking warfarin and comes in today for a routine follow up visit.  Negative risk factors for bleeding include an age less than 74 years old.  The bleeding index is 'low risk'.  Negative CHADS2 values include Age > 71 years old.  His last INR was 2.96.  Anticoagulation responsible provider: Daleen Squibb MD, Maisie Fus.  INR POC: 1.9.  Cuvette Lot#: [id number].    Anticoagulation Management Assessment/Plan:      The patient's current anticoagulation dose is Warfarin sodium 2.5 mg tabs: Use as directed by Anticoagualtion Clinic.  The target INR is 2.0-3.0.  The next INR is due 09/05/2009.  Anticoagulation instructions were given to spouse.  Results were reviewed/authorized by [name] Hey, [name] Hunter.  He was notified by [name] Hey [name] Hunter.         Prior Anticoagulation Instructions: INR 1.2 Take couamdin 1 1/2 tablets tonight then increase dose to 1 tablet once daily except 1/2 on Wednesdays  Current Anticoagulation Instructions: INR 1.9 Continue coumadin 2.5mg  once daily except 1.25mg  on Wednesdays

## 2010-02-11 NOTE — Medication Information (Signed)
Summary: ccr-lr  Anticoagulant Therapy  Managed by: Vashti Hey, RN PCP: Dr.Fusco Supervising MD: Diona Browner MD, Remi Deter Indication 1: Atrial Fibrillation Lab Used: [facility] [place] Site: [place] INR POC 2.9  Dietary changes: no    Health status changes: no    Bleeding/hemorrhagic complications: no    Recent/future hospitalizations: no    Any changes in medication regimen? no    Recent/future dental: no  Any missed doses?: no       Is patient compliant with meds? yes       Allergies: No Known Drug Allergies  Anticoagulation Management History:      The patient is taking warfarin and comes in today for a routine follow up visit.  Negative risk factors for bleeding include an age less than 81 years old.  The bleeding index is 'low risk'.  Negative CHADS2 values include Age > 64 years old.  His last INR was 2.96.  Anticoagulation responsible Kiyana Vazguez: Diona Browner MD, Remi Deter.  INR POC: 2.9.  Cuvette Lot#: [id number].    Anticoagulation Management Assessment/Plan:      The patient's current anticoagulation dose is Warfarin sodium 2.5 mg tabs: Use as directed by Anticoagualtion Clinic.  The target INR is 2.0-3.0.  The next INR is due 11/08/2009.  Anticoagulation instructions were given to spouse.  Results were reviewed/authorized by Vashti Hey, RN.  He was notified by Vashti Hey RN.         Prior Anticoagulation Instructions: INR 2.5 Continue coumadin 2.5mg  once daily except 1.25mg  on T,Th,Sat  Current Anticoagulation Instructions: INR 2.9 Continue coumadin 2.5mg  once daily except 1.25mg  on T,Th,Sat

## 2010-02-11 NOTE — Progress Notes (Signed)
Summary: BLOOD WORK  Phone Note Other Incoming Call back at [employer] [phone]   Caller: WIFE Summary of Call: PLEASE CALL TO DISCUSS BLOOD WORK Initial call taken by: Dorise Hiss,  September 06, 2009 2:29 PM  Follow-up for Phone Call        Pt needed to reschedule INR appt.  Done. Follow-up by: Vashti Hey RN,  September 06, 2009 4:15 PM

## 2010-02-11 NOTE — Medication Information (Signed)
Summary: ccr-lr  Anticoagulant Therapy  Managed by: Vashti Hey, RN PCP: Dr.Fusco Supervising MD: Diona Browner MD, Remi Deter Indication 1: Atrial Fibrillation Lab Used: [facility] [place] Site: [place] INR POC 4.2  Dietary changes: no    Health status changes: no    Bleeding/hemorrhagic complications: no    Recent/future hospitalizations: no    Any changes in medication regimen? no    Recent/future dental: no  Any missed doses?: no       Is patient compliant with meds? yes       Allergies: No Known Drug Allergies  Anticoagulation Management History:      The patient is taking warfarin and comes in today for a routine follow up visit.  Negative risk factors for bleeding include an age less than 18 years old.  The bleeding index is 'low risk'.  Negative CHADS2 values include Age > 54 years old.  His last INR was 2.96.  Anticoagulation responsible provider: Diona Browner MD, Remi Deter.  INR POC: 4.2.  Cuvette Lot#: [id number].    Anticoagulation Management Assessment/Plan:      The patient's current anticoagulation dose is Warfarin sodium 2.5 mg tabs: Use as directed by Anticoagualtion Clinic.  The target INR is 2.0-3.0.  The next INR is due 09/20/2009.  Anticoagulation instructions were given to spouse.  Results were reviewed/authorized by Vashti Hey, RN.  He was notified by Vashti Hey RN.         Prior Anticoagulation Instructions: INR 2.7 Continue coumadin 2.5mg  once daily except 1.25mg  on Wednesday   Current Anticoagulation Instructions: INR 4.2 Hold coumadin tonight then decrease dose to 2.5mg  once daily except 1.25mg  on T,Th,Sat

## 2010-02-11 NOTE — Medication Information (Signed)
Summary: ccr-lr  Anticoagulant Therapy  Managed by: Vashti Hey, RN PCP: Dr.Fusco Supervising MD: Andee Lineman MD, Michelle Piper Indication 1: Atrial Fibrillation Lab Used: [facility] [place] Site:  INR POC 2.5  Dietary changes: no    Health status changes: no    Bleeding/hemorrhagic complications: no    Recent/future hospitalizations: no    Any changes in medication regimen? no    Recent/future dental: no  Any missed doses?: no       Is patient compliant with meds? yes       Allergies: No Known Drug Allergies  Anticoagulation Management History:      The patient is taking warfarin and comes in today for a routine follow up visit.  Negative risk factors for bleeding include an age less than 12 years old.  The bleeding index is 'low risk'.  Negative CHADS2 values include Age > 21 years old.  His last INR was 2.96.  Anticoagulation responsible provider: Andee Lineman MD, Michelle Piper.  INR POC: 2.5.  Cuvette Lot#: [id number].    Anticoagulation Management Assessment/Plan:      The patient's current anticoagulation dose is Warfarin sodium 2.5 mg tabs: Use as directed by Anticoagualtion Clinic.  The target INR is 2.0-3.0.  The next INR is due 10/11/2009.  Anticoagulation instructions were given to spouse.  Results were reviewed/authorized by Vashti Hey, RN.  He was notified by Vashti Hey RN.         Prior Anticoagulation Instructions: INR 2.9 Continue coumadin 2.5mg  once daily except 1.25mg  on T,Th, Sat  Current Anticoagulation Instructions: INR 2.5 Continue coumadin 2.5mg  once daily except 1.25mg  on T,Th,Sat

## 2010-02-11 NOTE — Letter (Signed)
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, [place] [address]   Phone: [phone]  Fax: [phone]    08/27/2009  TO: Leodis Sias IT MAY CONCERN   RE: [name] Hunter [address] [place]   The above named individual is under my medical care and may return to work on: August 29th 2011 without restrictions.If you have any further questions or need additional information, please call.     Sincerely,   Dorothy Puffer.P.

## 2010-02-11 NOTE — Assessment & Plan Note (Signed)
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  Since his last visit, he has had no symptoms to suggest congestive heart failure.  He denies chest discomfort.  He has had no lightheadedness and certainly no syncope.  He has recognized no palpitations.  Cardiac Cath  Procedure date:  07/31/2009  Findings:       HEMODYNAMIC FINDINGS:  Central aortic pressure 155/97, left ventricular pressure 148/17, left ventricular end-diastolic pressure 24, right atrial pressure 6, right ventricular pressure 28/4, right ventricular end-diastolic pressure 6, pulmonary artery pressure 31/15, mean pulmonary artery pressure 23, pulmonary capillary wedge pressure 14. Cardiac output 4.06 liters per minute.  Cardiac index 2.15 liters/min./m2     ANGIOGRAPHIC FINDINGS:   1. The left main coronary artery had no evidence of obstructive       disease.   2. The left anterior descending was a large-caliber vessel with mild       luminal irregularities in the proximal and mid vessel.  These       lesions were less than 10%.  The first diagonal was small in       caliber with no disease.  The second diagonal was moderate           size and had no disease.   3. The circumflex artery was composed of an early obtuse marginal       branch that was moderate size and had no disease and second      obtuse marginal branch that was moderate size and had no      disease.  The AV groove circumflex was very small in caliber.   4. The right coronary artery was a large dominant vessel with mild        10% luminal irregularities in the mid vessel.   5. Left ventricular angiogram was performed in the RAO projection         and showed global left ventricular systolic dysfunction with EF          of  20%.  No mitral regurgitation.      IMPRESSION:   1. Mild nonobstructive coronary artery disease.   2. Severe global left ventricular systolic function.   3. Nonischemic cardiomyopathy.      RECOMMENDATIONS:  Nonischemic cardiomyopathy.  We   will restart his heparin and Coumadin therapy.  Cardioversion prior   to DC.   EKG  Procedure date:  11/25/2009  Findings:      Rhythm Strip  Marked sinus bradycardia at a rate of 45 bpm. Borderline intra-atrial conduction delay Borderline IVCD. No tracing for comparison.   Current Medications (verified): 1)  Warfarin Sodium 2.5 Mg Tabs (Warfarin Sodium) .... Use As Directed By Anticoagualtion Clinic 2)  Pacerone 200 Mg Tabs (Amiodarone Hcl) .... Take 1 Tablet By Mouth Once A Day 3)  Metoprolol Tartrate 25 Mg Tabs (Metoprolol Tartrate) .... Take One Tablet By Mouth Twice A Day 4)  Pravastatin Sodium 40 Mg Tabs (Pravastatin Sodium) .... Take One Tablet By Mouth Daily At Bedtime 5)  Lisinopril 10 Mg Tabs (Lisinopril) .... Take One Tablet By Mouth Daily 6)  Aspir-Low 81 Mg Tbec (Aspirin) .... Take 1 Tab Daily  Allergies (verified): No Known Drug Allergies  Comments:  Nurse/Medical Assistant: patient brought meds and i reviewed with patient  Past History:  Family History: Last updated: 11/25/2009 Father:arrhythmia requiring pacing Mother: Siblings:brother has had arrhythmia requiring pacemaker insertion  Social History: Last updated: 11/25/2009 Full Time Married and lives locally; 2 children Tobacco Use - one pack per day; 50 pack years Alcohol Use - no Regular Exercise - no Drug Use - no  PMH, FH, and Social History reviewed and updated.  Past Medical History: Atrial flutter-RFA in 2007; nl LV function in 2006; EF of 20% in 2011; no sig. coronary disease; +CHF;       DC cardioversion Hypertension Tobacco abuse-50 pack years Psoriasis Angioedema, possibly secondary to to be no indigestion COLONIC POLYPS  (ICD-211.3) PUD (ICD-533.90)  Past Surgical History: Right inguinal herniorrhaphy-6/07  Family History: Father:arrhythmia requiring pacing Mother: Siblings:brother has had arrhythmia requiring pacemaker insertion  Social History: Full Time Married and lives locally; 2 children Tobacco Use - one pack per day; 50 pack years Alcohol Use - no Regular Exercise - no Drug Use - no  Review of Systems       See history of present illness.  Vital Signs:  Patient profile:   65 year old male Weight:      200 pounds O2 Sat:      98 % on Room air Pulse rate:   47 / minute BP sitting:   117 / 55  (right arm)  Vitals Entered By: [name], CNA (November 25, 2009 1:41 PM)  O2 Flow:  Room air  Physical Exam  General:  Proportionate height and weight; well developed; no acute distress:   Neck-No JVD; no carotid bruits: Lungs-No tachypnea, few fine basilar rales-clear with cough; no rhonchi; no wheezes: Cardiovascular-normal PMI; normal S1 and S2: Abdomen-BS normal; soft and non-tender without masses or organomegaly:  Musculoskeletal-No deformities, no cyanosis or clubbing: Neurologic-Normal cranial nerves; symmetric strength and tone:  Skin-Warm, no significant lesions: Extremities-bounding distal pulses; no edema:     Impression & Recommendations:  Problem # 1:  ATRIAL FIBRILLATION (ICD-427.31) Arrhythmia appears to be controlled with amiodarone at a total dose of 400 mg q.d.  This will be reduced to 200 mg q.d.  Patient was cautioned regarding bradycardia and other adverse amiodarone effects.  Anticoagulation discussed with the patient and his wife as well.    Problem # 2:  CARDIOMYOPATHY, SECONDARY (ICD-425.9) LV systolic function has returned to normal, probably the result of rate control.  I doubt that [name] Hunter has intrinsic myocardial disease.  Metoprolol will be tapered due to the presence of bradycardia.  Lisinopril will be increased to adequately maintain control  of hypertension.  Digoxin will be discontinued, as this drug is not needed to treat CHF nor is the drug needed for control of heart rate.  Patient has insignificant coronary disease and mild hyperlipidemia.  Pravastatin will be started at a dose 20 mg q.d. with subsequent assessment of TSH, metabolic profile and fasting lipids.  Outpatient blood pressures will be checked and the patient advised to call for systolics greater than 140 or diastolics greater than 90.  I will reassess this nice gentleman in 4 months.    Problem # 3:  HYPERTENSION (ICD-401.1) Blood pressure is good; current medications will be continued.  Other Orders: T-Comprehensive Metabolic Panel [phone]) T-TSH [phone])  Patient Instructions: 1)  Your physician recommends that you schedule a follow-up appointment in: 4 months 2)  Your physician recommends that you return for lab work in: today 3)  Your physician has recommended you make the following change in your medication: stop  digoxin, decrease amiodorone to 200mg  daily, decrease metoprolol to 25mg  two times a day 4)  stop crestor, start pravastatin 40mg  daily, increase lisinopril 10mg  daily 5)  Your physician has requested that you regularly monitor and record your blood pressure readings at home.  Please use the same machine at the same time of day to check your readings and record them to bring to your follow-up visit. call for blood pressure greater than 140/90 Prescriptions: LISINOPRIL 10 MG TABS (LISINOPRIL) Take one tablet by mouth daily  #30 x 3   Entered by:   Teressa Lower RN   Authorized by:   Kathlen Brunswick, MD, [facility]   Signed by:   Teressa Lower RN on 11/25/2009   Method used:   Electronically to        [facility]  [address] (retail)       [address]       [place], [place]  [address]       Ph: [phone]       Fax: [phone]   RxID:   [id number] METOPROLOL TARTRATE 25 MG TABS (METOPROLOL TARTRATE) Take one tablet by  mouth twice a day  #60 x 3   Entered by:   Teressa Lower RN   Authorized by:   Kathlen Brunswick, MD, [facility]   Signed by:   Teressa Lower RN on 11/25/2009   Method used:   Electronically to        [facility]  [address] (retail)       [address]       [place], [place]  [address]       Ph: [phone]       Fax: [phone]   RxID:   [id number] PACERONE 200 MG TABS (AMIODARONE HCL) Take 1 tablet by mouth once a day  #30 x 3   Entered by:   Teressa Lower RN   Authorized by:   Kathlen Brunswick, MD, [facility]   Signed by:   Teressa Lower RN on 11/25/2009   Method used:   Electronically to        [facility]  [address] (retail)       [address]       [place], [place]  [address]       Ph: [phone]       Fax: [phone]   RxID:   [id number] PRAVASTATIN SODIUM 40 MG TABS (PRAVASTATIN SODIUM) Take one tablet by mouth daily at bedtime  #30 x 3   Entered by:   Teressa Lower RN   Authorized by:   Kathlen Brunswick, MD, [facility]   Signed by:   Teressa Lower RN on 11/25/2009   Method used:   Electronically to        [facility]  [address] (retail)       [address]       [place], [place]  [address]       Ph: [phone]       Fax: [phone]   RxID:   [id number]

## 2010-02-13 NOTE — Miscellaneous (Signed)
Summary: cxr 01/09/2010  Clinical Lists Changes  Observations: Added new observation of CXR RESULTS:  Clinical Data: 65 year old male with shortness of breath,   cardiomyopathy.    CHEST - 2 VIEW    Comparison: 07/24/2009 and earlier.    Findings: Cardiac size is stable at the upper limits of normal to   mildly increased. Other mediastinal contours are within normal   limits.  No pneumothorax, pulmonary edema, pleural effusion or   acute airspace opacity.  Stable mild diffuse increased interstitial   markings. Visualized tracheal air column is within normal limits.   No acute osseous abnormality identified.    IMPRESSION:   No acute cardiopulmonary abnormality.    Read By:  Augusto Gamble,  M.D.   Released By:  Augusto Gamble,  M.D.  Additional Information  HL7 RESULT STATUS : F  External IF Update Timestamp : 2010-01-09:17:20:23.000000  (01/09/2010 17:27)      CXR  Procedure date:  01/09/2010  Findings:       Clinical Data: 65 year old male with shortness of breath,   cardiomyopathy.    CHEST - 2 VIEW    Comparison: 07/24/2009 and earlier.    Findings: Cardiac size is stable at the upper limits of normal to   mildly increased. Other mediastinal contours are within normal   limits.  No pneumothorax, pulmonary edema, pleural effusion or   acute airspace opacity.  Stable mild diffuse increased interstitial   markings. Visualized tracheal air column is within normal limits.   No acute osseous abnormality identified.    IMPRESSION:   No acute cardiopulmonary abnormality.    Read By:  Augusto Gamble,  M.D.   Released By:  Augusto Gamble,  M.D.  Additional Information  HL7 RESULT STATUS : F  External IF Update Timestamp : 2010-01-09:17:20:23.000000

## 2010-02-13 NOTE — Miscellaneous (Signed)
Summary: labs cbcd,cmp,tsh 01/09/2010  Clinical Lists Changes  Observations: Added new observation of CALCIUM: 8.7 mg/dL (16/10/9602 54:09) Added new observation of ALBUMIN: 3.8 g/dL (81/19/1478 29:56) Added new observation of PROTEIN, TOT: 6.7 g/dL (21/30/8657 84:69) Added new observation of SGPT (ALT): 17 units/L (01/09/2010 17:27) Added new observation of SGOT (AST): 17 units/L (01/09/2010 17:27) Added new observation of ALK PHOS: 6.7 units/L (01/09/2010 17:27) Added new observation of BILI DIRECT: 17 mg/dL (62/95/2841 32:44) Added new observation of GFR AA: 17 mL/min/1.94m2 (01/09/2010 17:27) Added new observation of GFR: 79 mL/min (01/09/2010 17:27) Added new observation of CREATININE: 1.29 mg/dL (01/14/7251 66:44) Added new observation of BUN: 17 mg/dL (03/47/4259 56:38) Added new observation of BG RANDOM: 87 mg/dL (75/64/3329 51:88) Added new observation of CO2 PLSM/SER: 27 meq/L (01/09/2010 17:27) Added new observation of CL SERUM: 104 meq/L (01/09/2010 17:27) Added new observation of K SERUM: 4.6 meq/L (01/09/2010 17:27) Added new observation of NA: 140 meq/L (01/09/2010 17:27) Added new observation of TSH: 13.351 microintl units/mL (01/09/2010 17:27) Added new observation of ABSOLUTE BAS: 0.1 K/uL (01/09/2010 17:27) Added new observation of BASOPHIL %: 1 % (01/09/2010 17:27) Added new observation of EOS ABSLT: 0.1 K/uL (01/09/2010 17:27) Added new observation of % EOS AUTO: 1 % (01/09/2010 17:27) Added new observation of ABSOLUTE MON: 0.8 K/uL (01/09/2010 17:27) Added new observation of MONOCYTE %: 11 % (01/09/2010 17:27) Added new observation of ABS LYMPHOCY: 1.8 K/uL (01/09/2010 17:27) Added new observation of LYMPHS %: 24 % (01/09/2010 17:27) Added new observation of PLATELETK/UL: 305 K/uL (01/09/2010 17:27) Added new observation of RDW: 13.0 % (01/09/2010 17:27) Added new observation of MCHC RBC: 31.0 g/dL (41/66/0630 16:01) Added new observation of MCV: 99.0 fL  (01/09/2010 17:27) Added new observation of HCT: 41.0 % (01/09/2010 17:27) Added new observation of HGB: 12.7 g/dL (09/32/3557 32:20) Added new observation of RBC M/UL: 4.14 M/uL (01/09/2010 17:27) Added new observation of WBC COUNT: 7.8 10*3/microliter (01/09/2010 17:27)

## 2010-02-13 NOTE — Medication Information (Signed)
Summary: ccr-lr  Anticoagulant Therapy  Managed by: Vashti Hey, RN PCP: Dr.Fusco Supervising MD: Andee Lineman MD, Michelle Piper Indication 1: Atrial Fibrillation Lab Used: [facility] [place] Site: [place] INR POC 1.5  Dietary changes: no    Health status changes: no    Bleeding/hemorrhagic complications: no    Recent/future hospitalizations: no    Any changes in medication regimen? no    Recent/future dental: no  Any missed doses?: no       Is patient compliant with meds? yes       Allergies: No Known Drug Allergies  Anticoagulation Management History:      The patient is taking warfarin and comes in today for a routine follow up visit.  Negative risk factors for bleeding include an age less than 17 years old.  The bleeding index is 'low risk'.  Positive CHADS2 values include History of HTN.  Negative CHADS2 values include Age > 64 years old.  His last INR was 2.96.  Anticoagulation responsible provider: Andee Lineman MD, Michelle Piper.  INR POC: 1.5.  Cuvette Lot#: [id number].    Anticoagulation Management Assessment/Plan:      The patient's current anticoagulation dose is Warfarin sodium 2.5 mg tabs: Use as directed by Anticoagualtion Clinic.  The target INR is 2.0-3.0.  The next INR is due 01/10/2010.  Anticoagulation instructions were given to patient.  Results were reviewed/authorized by Vashti Hey, RN.  He was notified by Vashti Hey RN.         Prior Anticoagulation Instructions: INR 2.6 Continue coumadin 2.5mg  once daily except 1.25mg  on Tuesdays, Thursdays and Saturdays   Current Anticoagulation Instructions: INR 1.5 Take coumadin 1 1/2 tablets tonight, 1 tablet tomorrow night then resume 1 tablet once daily except 1/2 tablet on T,Th,Sat

## 2010-02-13 NOTE — Assessment & Plan Note (Signed)
Summary: ROV SOB   Visit Type:  Follow-up Primary [place] Hewes:  Dr.Fusco   History of Present Illness: [name] Hunter returns to [place] for continuing treatment and assessment of atrial arrhythmias and presumed tachycardia mediated cardiomyopathy.  He has been on amiodarone 200mg  daily per Dr. Dietrich Pates on last visit. He has been complaining of increased DOE since last visit but no bradycardia or tachycardia.  His wife concurs with this and has noticed her husband breathing harder.He denies chest pain.  Current Medications (verified): 1)  Warfarin Sodium 2.5 Mg Tabs (Warfarin Sodium) .... Use As Directed By Anticoagualtion Clinic 2)  Pacerone 200 Mg Tabs (Amiodarone Hcl) .... Take 1 Tablet By Mouth Once A Day 3)  Metoprolol Tartrate 25 Mg Tabs (Metoprolol Tartrate) .... Take One Tablet By Mouth Twice A Day 4)  Pravastatin Sodium 40 Mg Tabs (Pravastatin Sodium) .... Take One Tablet By Mouth Daily At Bedtime 5)  Lisinopril 5 Mg Tabs (Lisinopril) .... Take 1 Tab Daily 6)  Aspir-Low 81 Mg Tbec (Aspirin) .... Take 1 Tab Daily  Allergies (verified): No Known Drug Allergies  Comments:  Nurse/Medical Assistant: patient brought meds he uses [facility] in [place]  Past History:  Past medical, surgical, family and social histories (including risk factors) reviewed, and no changes noted (except as noted below).  Past Medical History: Reviewed history from 11/25/2009 and no changes required. Atrial flutter-RFA in 2007; nl LV function in 2006; EF of 20% in 2011; no sig. coronary disease; +CHF;       DC cardioversion Hypertension Tobacco abuse-50 pack years Psoriasis Angioedema, possibly secondary to to be no indigestion COLONIC POLYPS (ICD-211.3) PUD (ICD-533.90)  Past Surgical History: Reviewed history from 11/25/2009 and no changes required. Right inguinal herniorrhaphy-6/07  Family History: Reviewed history from 11/25/2009 and no changes required. Father:arrhythmia requiring  pacing Mother: Siblings:brother has had arrhythmia requiring pacemaker insertion  Social History: Reviewed history from 11/25/2009 and no changes required. Full Time Married and lives locally; 2 children Tobacco Use - one pack per day; 50 pack years Alcohol Use - no Regular Exercise - no Drug Use - no  Review of Systems       All other systems have been reviewed and are negative unless stated above.   Vital Signs:  Patient profile:   65 year old male Weight:      200 pounds BMI:     29.64 Pulse rate:   56 / minute BP sitting:   106 / 59  (right arm)  Vitals Entered By: [name], CNA (January 09, 2010 3:16 PM)  Physical Exam  General:  Well developed, well nourished, in no acute distress. Lungs:  Clear bilaterally to auscultation and percussion. Heart:  Non-displaced PMI, chest non-tender; regular rate and rhythm, S1, S2 without murmurs, rubs or gallops. Carotid upstroke normal, no bruit. Normal abdominal aortic size, no bruits. Femorals normal pulses, no bruits. Pedals normal pulses. No edema, no varicosities. Abdomen:  Bowel sounds positive; abdomen soft and non-tender without masses, organomegaly, or hernias noted. No hepatosplenomegaly. Msk:  Back normal, normal gait. Muscle strength and tone normal. Pulses:  pulses normal in all 4 extremities Extremities:  No clubbing or cyanosis. Neurologic:  Alert and oriented x 3. Psych:  Normal affect.   EKG  Procedure date:  01/09/2010  Findings:      Sinus bradycardia with rate of:  56bpm  Impression & Recommendations:  Problem # 1:  ATRIAL FIBRILLATION (ICD-427.31) His rate is controlled on metoprolol and amiodarone.  I am concerned about amiodarone  toxicity with patients complaints of DOE. We have had him walk here in the clinic with 02 Sat. Ranged 93%-92% with walking.  He did not get short of breath.  We will draw TSH, CBC, CMET and CXR.  Consider stopping amiodarone. He will follow-up with Dr. Dietrich Pates. He will  continue on coumadin with follow-up in our clinic. His updated medication list for this problem includes:    Warfarin Sodium 2.5 Mg Tabs (Warfarin sodium) ..... Use as directed by anticoagualtion clinic    Pacerone 200 Mg Tabs (Amiodarone hcl) .Marland Kitchen... Take 1 tablet by mouth once a day    Metoprolol Tartrate 25 Mg Tabs (Metoprolol tartrate) .Marland Kitchen... Take one tablet by mouth twice a day    Aspir-low 81 Mg Tbec (Aspirin) .Marland Kitchen... Take 1 tab daily  Problem # 2:  HYPERTENSION (ICD-401.1) Low normal.  No changes in medications at this time. His updated medication list for this problem includes:    Metoprolol Tartrate 25 Mg Tabs (Metoprolol tartrate) .Marland Kitchen... Take one tablet by mouth twice a day    Lisinopril 5 Mg Tabs (Lisinopril) .Marland Kitchen... Take 1 tab daily    Aspir-low 81 Mg Tbec (Aspirin) .Marland Kitchen... Take 1 tab daily  Orders: T-Comprehensive Metabolic Panel [phone])  Other Orders: T-Chest x-ray, 2 views ([id number]) T-TSH ([id number]) T-CBC w/Diff ([id number]) Misc. Referral (Misc. Ref)  Patient Instructions: 1)  Your physician recommends that you schedule a follow-up appointment in: 1 month 2)  Your physician recommends that you return for lab work in: today 3)  You have been referred to a podiatrist for knot on your foot. 4)  A chest x-ray takes a picture of the organs and structures inside the chest, including the heart, lungs, and blood vessels. This test can show several things, including, whether the heart is enlarged; whether fluid is building up in the lungs; and whether pacemaker / defibrillator leads are still in place.

## 2010-02-13 NOTE — Letter (Signed)
Summary: [facility], agricultural at [employer]  [address], [place] [address]   Phone: [phone]  Fax: [phone]     January 17, 2010 MRN: [id number]   [name] Hunter [address] [place], [place]  [address]      YOUR LAB WORK IS DUE   February 28, 2010  Please go to [facility], located across the street from [facility] on the second floor.  Hours are Monday - Friday 7am until 7:30pm         Saturday 8am until 12noon      _X_ YOUR LABWORK IS NOT FASTING --YOU MAY EAT PRIOR TO LABWORK

## 2010-02-13 NOTE — Medication Information (Signed)
Summary: ccr-lr  Anticoagulant Therapy  Managed by: Vashti Hey, RN PCP: Dr.Fusco Supervising MD: Dietrich Pates MD, Molly Maduro Indication 1: Atrial Fibrillation Lab Used: [facility] [place] Site:  INR POC 2.4  Dietary changes: no    Health status changes: no    Bleeding/hemorrhagic complications: no    Recent/future hospitalizations: no    Any changes in medication regimen? no    Recent/future dental: no  Any missed doses?: no       Is patient compliant with meds? yes       Allergies: No Known Drug Allergies  Anticoagulation Management History:      The patient is taking warfarin and comes in today for a routine follow up visit.  Negative risk factors for bleeding include an age less than 36 years old.  The bleeding index is 'low risk'.  Positive CHADS2 values include History of HTN.  Negative CHADS2 values include Age > 100 years old.  His last INR was 2.96.  Anticoagulation responsible provider: Dietrich Pates MD, Molly Maduro.  INR POC: 2.4.  Cuvette Lot#: [id number].    Anticoagulation Management Assessment/Plan:      The patient's current anticoagulation dose is Warfarin sodium 2.5 mg tabs: Use as directed by Anticoagualtion Clinic.  The target INR is 2.0-3.0.  The next INR is due 02/07/2010.  Anticoagulation instructions were given to patient.  Results were reviewed/authorized by Vashti Hey, RN.  He was notified by Vashti Hey RN.         Prior Anticoagulation Instructions: INR 1.5 Take coumadin 1 1/2 tablets tonight, 1 tablet tomorrow night then resume 1 tablet once daily except 1/2 tablet on T,Th,Sat  Current Anticoagulation Instructions: INR 2.4 Continue coumadin 2.5mg  once daily except 1.25mg  on Tuesdays, Thursdays and Saturdays

## 2010-02-13 NOTE — Progress Notes (Signed)
Summary: PFT RESULTS  Phone Note Call from Patient Call back at Home Phone [phone]   Caller: PT Reason for Call: Lab or Test Results Summary of Call: PFT RESULTS Initial call taken by: Faythe Ghee,  January 30, 2010 2:31 PM  Follow-up for Phone Call        Left results on answering machine Follow-up by: Teressa Lower RN,  January 30, 2010 4:53 PM

## 2010-02-13 NOTE — Medication Information (Signed)
Summary: ccr-lr  Anticoagulant Therapy  Managed by: Vashti Hey, RN PCP: Dr.Fusco Supervising MD: Andee Lineman MD, Michelle Piper Indication 1: Atrial Fibrillation Lab Used: [facility] [place] Site: [place] INR POC 1.7  Dietary changes: no    Health status changes: no    Bleeding/hemorrhagic complications: no    Recent/future hospitalizations: no    Any changes in medication regimen? no    Recent/future dental: no  Any missed doses?: no       Is patient compliant with meds? yes       Allergies: No Known Drug Allergies  Anticoagulation Management History:      The patient is taking warfarin and comes in today for a routine follow up visit.  Negative risk factors for bleeding include an age less than 12 years old.  The bleeding index is 'low risk'.  Positive CHADS2 values include History of HTN.  Negative CHADS2 values include Age > 42 years old.  His last INR was 2.96.  Anticoagulation responsible provider: Andee Lineman MD, Michelle Piper.  INR POC: 1.7.  Cuvette Lot#: [id number].    Anticoagulation Management Assessment/Plan:      The patient's current anticoagulation dose is Warfarin sodium 2.5 mg tabs: Use as directed by Anticoagualtion Clinic.  The target INR is 2.0-3.0.  The next INR is due 02/28/2010.  Anticoagulation instructions were given to patient.  Results were reviewed/authorized by Vashti Hey, RN.  He was notified by Vashti Hey RN.         Prior Anticoagulation Instructions: INR 2.4 Continue coumadin 2.5mg  once daily except 1.25mg  on Tuesdays, Thursdays and Saturdays  Current Anticoagulation Instructions: INR 1.7 Denies missing doses Increase coumadin to 2.5mg  once daily except 1.25mg  on T,Th

## 2010-02-13 NOTE — Assessment & Plan Note (Signed)
Summary: 1 mth f/u per checkout on 01/09/10/tg   Visit Type:  Follow-up Primary Provider:  Dr.Fusco   History of Present Illness: Mr. [name] Hunter returns for continuing treatment of atrial fibrillation and atrial flutter.  Since his last visit, he has noted only momentary palpitations.  He has had no orthopnea, PND, syncope nor chest pain.  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     Current Medications (verified): 1)  Warfarin Sodium 2.5 Mg Tabs (Warfarin Sodium) .... Use As Directed By Anticoagualtion Clinic 2)  Pravastatin Sodium 40 Mg Tabs (Pravastatin Sodium) .... Take One Tablet By Mouth Daily At Bedtime 3)  Lisinopril 5 Mg Tabs (Lisinopril) .... Take 1 Tab Daily 4)  Aspir-Low 81 Mg Tbec (Aspirin) .... Take 1 Tab Daily 5)  Pacerone 200 Mg Tabs (Amiodarone Hcl) .... Take 1 Tab Daily 6)  Synthroid 50 Mcg Tabs (Levothyroxine Sodium) .... Take 1 Tablet By Mouth Once A Day  Allergies (verified): No Known Drug Allergies  Comments:  Nurse/Medical Assistant: patient didn't bring meds or list [facility] [place]  Past History:  PMH, FH, and Social History reviewed and updated.  Past Medical History: Atrial flutter-RFA in 2007; nl LV function in 2006; EF of 20% in 2011; no sig. coronary disease; +CHF;       DC cardioversion Hypertension Tobacco abuse-50 pack years Psoriasis Angioedema, possibly secondary to tomato indigestion COLONIC POLYPS (ICD-211.3) PUD (ICD-533.90)  Review of Systems       See history of present illness.  Vital Signs:  Patient profile:   65 year old male Weight:      199 pounds O2 Sat:      95 % on Room air Pulse rate:   57 / minute BP sitting:   126 / 72  (left arm)  Vitals Entered By: [name], CNA (January 17, 2010 12:56 PM)  O2 Flow:  Room air  Physical Exam  General:  Mildly overweight; well developed; no acute distress:   Neck-No JVD; no carotid  bruits: Lungs-No tachypnea, no rales; no rhonchi; no wheezes: Cardiovascular-normal PMI; distant normal S1 and S2: Abdomen-BS normal; soft and non-tender without masses or organomegaly:  Musculoskeletal-No deformities, no cyanosis or clubbing: Neurologic-Normal cranial nerves; symmetric strength and tone:  Skin-Warm, psoriasis of the umbilicus and axilla Extremities-Nl distal pulses; no edema:     Impression & Recommendations:  Problem # 1:  ATRIAL FIBRILLATION (ICD-427.31) No symptoms to suggest recurrent atrial arrhythmias.  Treatment with amiodarone will be continued with appropriate monitoring studies.  Problem # 2:  ANTICOAGULATION (ICD-V58.61) Risk of recurrent atrial fibrillation is significant; anticoagulation will be continued.  Problem # 3:  HYPERTENSION (ICD-401.1) Blood pressure control is excellent.  BP today: 126/72 Prior BP: 106/59 (01/09/2010)  Labs Reviewed: K+: 4.6 (01/09/2010) Creat: : 1.29 (01/09/2010)    Problem # 4:  HYPOTHYROIDISM (ICD-244.9) Likely the result of treatment with amiodarone.  Treatment with levothyroxine will be initiated at a dose of 0.5 mg q.d.  TFTs will be rechecked in 6 weeks.  Problem # 5:  DYSPNEA (ICD-786.05) Dyspnea could be related to hypothyroidism, although this process appears to be very mild, or bradycardia.  Metoprolol will be discontinued, which probably will not result in loss of control of hypertension.  Hypothyroidism will be treated as noted above. If symptoms persist, PFTs will be obtained.   Problem # 6:  PSORIASIS (ICD-696.1) Patient referred to dermatologist, Dr. Margo Aye, at his  request.  I will plan to see this nice gentleman again in 4 months.  Other Orders: Pulmonary Function Test (PFT) Future Orders: T-TSH ([id number]) ... 02/28/2010 T-T3, Free [phone]) ... 02/28/2010 T-T4, Free [phone]) ... 02/28/2010  Patient Instructions: 1)  Your physician recommends that you schedule a follow-up appointment  in: 4 months 2)  Your physician recommends that you return for lab work in: 6 weeks 3)  Your physician has recommended that you have a pulmonary function test.  Pulmonary Function Tests are a group of tests that measure how well air moves in and out of your lungs. 4)  Your physician has recommended you make the following change in your medication: STOP METOPROLOL, BEGIN SYNTHROID 0.05MG  DAILY  EKG  Procedure date:  01/17/2010  Findings:      Rhythm Strip  Sinus bradycardia at a rate of 51 bpm Borderline IVCD Borderline first degree AV block.  Prescriptions: SYNTHROID 50 MCG TABS (LEVOTHYROXINE SODIUM) Take 1 tablet by mouth once a day  #30 x 3   Entered by:   Teressa Lower RN   Authorized by:   Kathlen Brunswick, MD, [facility]   Signed by:   Teressa Lower RN on 01/17/2010   Method used:   Electronically to        [facility]  [address] (retail)       [address]       [place], [place]  [address]       Ph: [phone]       Fax: [phone]   RxID:   [id number]

## 2010-02-28 ENCOUNTER — Encounter (INDEPENDENT_AMBULATORY_CARE_PROVIDER_SITE_OTHER): Payer: BC Managed Care – PPO

## 2010-02-28 ENCOUNTER — Encounter: Payer: Self-pay | Admitting: Cardiology

## 2010-02-28 DIAGNOSIS — Z7901 Long term (current) use of anticoagulants: Secondary | ICD-10-CM

## 2010-02-28 DIAGNOSIS — I4891 Unspecified atrial fibrillation: Secondary | ICD-10-CM

## 2010-02-28 LAB — CONVERTED CEMR LAB: POC INR: 2.3

## 2010-03-05 NOTE — Medication Information (Signed)
Summary: ccr-lr  Anticoagulant Therapy  Managed by: Vashti Hey, RN PCP: Dr.Fusco Supervising MD: Myrtis Ser MD, Tinnie Gens Indication 1: Atrial Fibrillation Lab Used: [facility] [place] Site: [place] INR POC 2.3  Dietary changes: no    Health status changes: no    Bleeding/hemorrhagic complications: no    Recent/future hospitalizations: no    Any changes in medication regimen? no    Recent/future dental: no  Any missed doses?: no       Is patient compliant with meds? yes       Allergies: No Known Drug Allergies  Anticoagulation Management History:      The patient is taking warfarin and comes in today for a routine follow up visit.  Negative risk factors for bleeding include an age less than 57 years old.  The bleeding index is 'low risk'.  Positive CHADS2 values include History of HTN.  Negative CHADS2 values include Age > 66 years old.  His last INR was 2.96.  Anticoagulation responsible provider: Myrtis Ser MD, Tinnie Gens.  INR POC: 2.3.  Cuvette Lot#: [id number].    Anticoagulation Management Assessment/Plan:      The patient's current anticoagulation dose is Warfarin sodium 2.5 mg tabs: Use as directed by Anticoagualtion Clinic.  The target INR is 2.0-3.0.  The next INR is due 03/28/2010.  Anticoagulation instructions were given to patient.  Results were reviewed/authorized by Vashti Hey, RN.  He was notified by Vashti Hey RN.         Prior Anticoagulation Instructions: INR 1.7 Denies missing doses Increase coumadin to 2.5mg  once daily except 1.25mg  on T,Th  Current Anticoagulation Instructions: INR 2.3 Continue coumadin 2.5mg  once daily except 1.25mg  on T,Th

## 2010-03-22 ENCOUNTER — Encounter (INDEPENDENT_AMBULATORY_CARE_PROVIDER_SITE_OTHER): Payer: Self-pay | Admitting: *Deleted

## 2010-03-25 NOTE — Letter (Signed)
Summary: [facility] at [place]  [address] [place] [address]   Phone: [phone]  Fax: [phone]     March 22, 2010 MRN: [id number]   LAUREANO HETZER [address] [place], [place]  [address]   Dear Mr. TAVES,  During your last appointment, Dr.   Dietrich Pates  requested you have some follow-up blood work.  Our records indicate you have not had this done.  Please have this blood work done as soon as possible.  It is important that patients and their doctor work together in the management/treatment of their health care.     Thanks,  [employer]

## 2010-03-28 ENCOUNTER — Encounter: Payer: Self-pay | Admitting: Cardiology

## 2010-03-28 ENCOUNTER — Encounter (INDEPENDENT_AMBULATORY_CARE_PROVIDER_SITE_OTHER): Payer: BC Managed Care – PPO

## 2010-03-28 ENCOUNTER — Telehealth: Payer: Self-pay | Admitting: *Deleted

## 2010-03-28 DIAGNOSIS — Z7901 Long term (current) use of anticoagulants: Secondary | ICD-10-CM

## 2010-03-28 DIAGNOSIS — I4891 Unspecified atrial fibrillation: Secondary | ICD-10-CM

## 2010-03-28 LAB — CONVERTED CEMR LAB
Free T4: 1.11 ng/dL (ref 0.80–1.80)
TSH: 3.216 microintl units/mL (ref 0.350–4.500)

## 2010-03-29 ENCOUNTER — Telehealth: Payer: Self-pay | Admitting: *Deleted

## 2010-03-29 LAB — PROTIME-INR
INR: 1.21 (ref 0.00–1.49)
INR: 1.39 (ref 0.00–1.49)
INR: 2.64 — ABNORMAL HIGH (ref 0.00–1.49)
INR: 2.96 — ABNORMAL HIGH (ref 0.00–1.49)
Prothrombin Time: 13.7 seconds (ref 11.6–15.2)
Prothrombin Time: 14.6 seconds (ref 11.6–15.2)
Prothrombin Time: 15.2 seconds (ref 11.6–15.2)
Prothrombin Time: 16.9 seconds — ABNORMAL HIGH (ref 11.6–15.2)
Prothrombin Time: 17.8 seconds — ABNORMAL HIGH (ref 11.6–15.2)
Prothrombin Time: 21.6 seconds — ABNORMAL HIGH (ref 11.6–15.2)

## 2010-03-29 LAB — BASIC METABOLIC PANEL
BUN: 11 mg/dL (ref 6–23)
CO2: 28 mEq/L (ref 19–32)
CO2: 29 mEq/L (ref 19–32)
Calcium: 9.1 mg/dL (ref 8.4–10.5)
Calcium: 8.9 mg/dL (ref 8.4–10.5)
Calcium: 9.2 mg/dL (ref 8.4–10.5)
Chloride: 103 mEq/L (ref 96–112)
Chloride: 102 mEq/L (ref 96–112)
Creatinine, Ser: 0.87 mg/dL (ref 0.4–1.5)
Creatinine, Ser: 0.86 mg/dL (ref 0.4–1.5)
GFR calc Af Amer: >60 mL/min (ref >60)
GFR calc Af Amer: >60 mL/min (ref >60)
GFR calc Af Amer: >60 mL/min (ref >60)
GFR calc Af Amer: >60 mL/min (ref >60)
GFR calc non Af Amer: >60 mL/min (ref >60)
GFR calc non Af Amer: >60 mL/min (ref >60)
GFR calc non Af Amer: >60 mL/min (ref >60)
Glucose, Bld: 100 mg/dL — ABNORMAL HIGH (ref 70–99)
Glucose, Bld: 107 mg/dL — ABNORMAL HIGH (ref 70–99)
Potassium: 4.2 mEq/L (ref 3.5–5.1)
Potassium: 3.8 mEq/L (ref 3.5–5.1)
Potassium: 3.9 mEq/L (ref 3.5–5.1)
Potassium: 4.3 mEq/L (ref 3.5–5.1)
Sodium: 140 mEq/L (ref 135–145)
Sodium: 139 mEq/L (ref 135–145)
Sodium: 142 mEq/L (ref 135–145)

## 2010-03-29 LAB — HEPARIN LEVEL (UNFRACTIONATED)
Heparin Unfractionated: 0.38 IU/mL (ref 0.30–0.70)
Heparin Unfractionated: 0.52 IU/mL (ref 0.30–0.70)
Heparin Unfractionated: 0.54 IU/mL (ref 0.30–0.70)
Heparin Unfractionated: 0.55 IU/mL (ref 0.30–0.70)
Heparin Unfractionated: 0.56 IU/mL (ref 0.30–0.70)
Heparin Unfractionated: 0.57 IU/mL (ref 0.30–0.70)
Heparin Unfractionated: 0.7 IU/mL (ref 0.30–0.70)
Heparin Unfractionated: 0.71 IU/mL — ABNORMAL HIGH (ref 0.30–0.70)

## 2010-03-29 LAB — CBC
HCT: 41.5 % (ref 39.0–52.0)
HCT: 41.7 % (ref 39.0–52.0)
HCT: 42.9 % (ref 39.0–52.0)
Hemoglobin: 13.5 g/dL (ref 13.0–17.0)
Hemoglobin: 13.8 g/dL (ref 13.0–17.0)
Hemoglobin: 14 g/dL (ref 13.0–17.0)
Hemoglobin: 14 g/dL (ref 13.0–17.0)
Hemoglobin: 14.4 g/dL (ref 13.0–17.0)
MCH: 30.8 pg (ref 26.0–34.0)
MCH: 30.9 pg (ref 26.0–34.0)
MCV: 92.3 fL (ref 78.0–100.0)
MCV: 92.7 fL (ref 78.0–100.0)
MCV: 93.1 fL (ref 78.0–100.0)
Platelets: 214 10*3/uL (ref 150–400)
Platelets: 218 10*3/uL (ref 150–400)
Platelets: 225 10*3/uL (ref 150–400)
Platelets: 241 10*3/uL (ref 150–400)
Platelets: 246 10*3/uL (ref 150–400)
RBC: 4.36 MIL/uL (ref 4.22–5.81)
RBC: 4.46 MIL/uL (ref 4.22–5.81)
RBC: 4.49 MIL/uL (ref 4.22–5.81)
RBC: 4.53 MIL/uL (ref 4.22–5.81)
RBC: 4.54 MIL/uL (ref 4.22–5.81)
RBC: 4.65 MIL/uL (ref 4.22–5.81)
RBC: 4.94 MIL/uL (ref 4.22–5.81)
RDW: 14.7 % (ref 11.5–15.5)
RDW: 14.8 % (ref 11.5–15.5)
WBC: 9 10*3/uL (ref 4.0–10.5)
WBC: 9.4 10*3/uL (ref 4.0–10.5)
WBC: 9.5 10*3/uL (ref 4.0–10.5)
WBC: 9.6 10*3/uL (ref 4.0–10.5)
WBC: 9.6 10*3/uL (ref 4.0–10.5)
WBC: 9.7 10*3/uL (ref 4.0–10.5)

## 2010-03-29 LAB — POCT I-STAT 3, VENOUS BLOOD GAS (G3P V)
O2 Saturation: 59 %
TCO2: 27 mmol/L (ref 0–100)
pCO2, Ven: 49.3 mmHg (ref 45.0–50.0)
pH, Ven: 7.315 — ABNORMAL HIGH (ref 7.250–7.300)

## 2010-03-29 LAB — LIPID PANEL
HDL: 43 mg/dL (ref >39)
Total CHOL/HDL Ratio: 3.9 RATIO

## 2010-03-29 LAB — POCT I-STAT 3, ART BLOOD GAS (G3+)
Acid-base deficit: 1 mmol/L (ref 0.0–2.0)
Bicarbonate: 24.7 mEq/L — ABNORMAL HIGH (ref 20.0–24.0)
pH, Arterial: 7.379 (ref 7.350–7.450)
pO2, Arterial: 64 mmHg — ABNORMAL LOW (ref 80.0–100.0)

## 2010-03-29 LAB — APTT: aPTT: 136 seconds — ABNORMAL HIGH (ref 24–37)

## 2010-03-30 LAB — DIFFERENTIAL
Basophils Absolute: 0 10*3/uL (ref 0.0–0.1)
Basophils Absolute: 0.1 10*3/uL (ref 0.0–0.1)
Basophils Relative: 0 % (ref 0–1)
Basophils Relative: 1 % (ref 0–1)
Eosinophils Absolute: 0.1 10*3/uL (ref 0.0–0.7)
Eosinophils Absolute: 0.2 10*3/uL (ref 0.0–0.7)
Eosinophils Relative: 1 % (ref 0–5)
Eosinophils Relative: 2 % (ref 0–5)
Lymphocytes Relative: 21 % (ref 12–46)
Lymphocytes Relative: 22 % (ref 12–46)
Lymphocytes Relative: 23 % (ref 12–46)
Lymphs Abs: 1.6 10*3/uL (ref 0.7–4.0)
Lymphs Abs: 1.9 10*3/uL (ref 0.7–4.0)
Monocytes Absolute: 0.5 10*3/uL (ref 0.1–1.0)
Monocytes Absolute: 0.6 10*3/uL (ref 0.1–1.0)
Monocytes Absolute: 0.6 10*3/uL (ref 0.1–1.0)
Monocytes Absolute: 0.6 10*3/uL (ref 0.1–1.0)
Monocytes Relative: 7 % (ref 3–12)
Monocytes Relative: 7 % (ref 3–12)
Neutro Abs: 5.5 10*3/uL (ref 1.7–7.7)
Neutro Abs: 6.1 10*3/uL (ref 1.7–7.7)
Neutrophils Relative %: 71 % (ref 43–77)

## 2010-03-30 LAB — CBC
HCT: 37.1 % — ABNORMAL LOW (ref 39.0–52.0)
HCT: 37.3 % — ABNORMAL LOW (ref 39.0–52.0)
HCT: 38.9 % — ABNORMAL LOW (ref 39.0–52.0)
HCT: 39 % (ref 39.0–52.0)
Hemoglobin: 12.6 g/dL — ABNORMAL LOW (ref 13.0–17.0)
Hemoglobin: 13.2 g/dL (ref 13.0–17.0)
MCH: 30.8 pg (ref 26.0–34.0)
MCHC: 33.8 g/dL (ref 30.0–36.0)
MCV: 91.3 fL (ref 78.0–100.0)
MCV: 92.1 fL (ref 78.0–100.0)
Platelets: 187 10*3/uL (ref 150–400)
Platelets: 221 10*3/uL (ref 150–400)
RBC: 4.06 MIL/uL — ABNORMAL LOW (ref 4.22–5.81)
RBC: 4.27 MIL/uL (ref 4.22–5.81)
RDW: 15.1 % (ref 11.5–15.5)
RDW: 15.1 % (ref 11.5–15.5)
RDW: 15.2 % (ref 11.5–15.5)
RDW: 15.4 % (ref 11.5–15.5)
WBC: 7.7 10*3/uL (ref 4.0–10.5)
WBC: 7.9 10*3/uL (ref 4.0–10.5)
WBC: 8.5 10*3/uL (ref 4.0–10.5)
WBC: 8.8 10*3/uL (ref 4.0–10.5)

## 2010-03-30 LAB — BLOOD GAS, ARTERIAL
Acid-Base Excess: 1.7 mmol/L (ref 0.0–2.0)
Bicarbonate: 25.2 mEq/L — ABNORMAL HIGH (ref 20.0–24.0)
O2 Saturation: 90.9 %
Patient temperature: 37
TCO2: 22 mmol/L (ref 0–100)

## 2010-03-30 LAB — CK TOTAL AND CKMB (NOT AT ARMC)
CK, MB: 2.1 ng/mL (ref 0.3–4.0)
CK, MB: 2.3 ng/mL (ref 0.3–4.0)
Relative Index: INVALID (ref 0.0–2.5)
Total CK: 60 U/L (ref 7–232)
Total CK: 62 U/L (ref 7–232)

## 2010-03-30 LAB — BRAIN NATRIURETIC PEPTIDE: Pro B Natriuretic peptide (BNP): 357 pg/mL — ABNORMAL HIGH (ref 0.0–100.0)

## 2010-03-30 LAB — BASIC METABOLIC PANEL
BUN: 8 mg/dL (ref 6–23)
CO2: 27 mEq/L (ref 19–32)
Calcium: 9.1 mg/dL (ref 8.4–10.5)
Chloride: 106 mEq/L (ref 96–112)
Chloride: 106 mEq/L (ref 96–112)
Creatinine, Ser: 0.89 mg/dL (ref 0.4–1.5)
GFR calc Af Amer: >60 mL/min (ref >60)
GFR calc Af Amer: >60 mL/min (ref >60)
GFR calc non Af Amer: >60 mL/min (ref >60)
Glucose, Bld: 111 mg/dL — ABNORMAL HIGH (ref 70–99)
Potassium: 3.7 mEq/L (ref 3.5–5.1)
Potassium: 3.9 mEq/L (ref 3.5–5.1)
Sodium: 138 mEq/L (ref 135–145)

## 2010-03-30 LAB — CULTURE, BLOOD (ROUTINE X 2)
Culture: NO GROWTH
Culture: NO GROWTH
Report Status: 7162011
Report Status: 7162011

## 2010-03-30 LAB — POCT CARDIAC MARKERS
Myoglobin, poc: 82.1 ng/mL (ref 12–200)
Troponin i, poc: <0.05 ng/mL (ref 0.00–0.09)

## 2010-03-30 LAB — COMPREHENSIVE METABOLIC PANEL
ALT: 19 U/L (ref 0–53)
AST: 21 U/L (ref 0–37)
Albumin: 3.6 g/dL (ref 3.5–5.2)
Alkaline Phosphatase: 85 U/L (ref 39–117)
Potassium: 3.9 mEq/L (ref 3.5–5.1)
Sodium: 142 mEq/L (ref 135–145)
Total Protein: 7 g/dL (ref 6.0–8.3)

## 2010-03-30 LAB — CULTURE, RESPIRATORY W GRAM STAIN

## 2010-03-30 LAB — MRSA PCR SCREENING: MRSA by PCR: NEGATIVE

## 2010-03-30 LAB — EXPECTORATED SPUTUM ASSESSMENT W GRAM STAIN, RFLX TO RESP C

## 2010-03-30 LAB — MAGNESIUM: Magnesium: 2.3 mg/dL (ref 1.5–2.5)

## 2010-03-30 LAB — PROTIME-INR: Prothrombin Time: 14.8 seconds (ref 11.6–15.2)

## 2010-04-01 NOTE — Medication Information (Signed)
Summary: ccr-lr  Anticoagulant Therapy  Managed by: Vashti Hey, RN PCP: Dr.Fusco Supervising MD: Andee Lineman MD, Michelle Piper Indication 1: Atrial Fibrillation Lab Used: [facility] [place] Site: [place] INR POC 2.4  Dietary changes: no    Health status changes: no    Bleeding/hemorrhagic complications: no    Recent/future hospitalizations: no    Any changes in medication regimen? no    Recent/future dental: no  Any missed doses?: no       Is patient compliant with meds? yes       Allergies: No Known Drug Allergies  Anticoagulation Management History:      The patient is taking warfarin and comes in today for a routine follow up visit.  Negative risk factors for bleeding include an age less than 14 years old.  The bleeding index is 'low risk'.  Positive CHADS2 values include History of HTN.  Negative CHADS2 values include Age > 58 years old.  His last INR was 2.96.  Anticoagulation responsible provider: Andee Lineman MD, Michelle Piper.  INR POC: 2.4.  Cuvette Lot#: [id number].    Anticoagulation Management Assessment/Plan:      The patient's current anticoagulation dose is Warfarin sodium 2.5 mg tabs: Use as directed by Anticoagualtion Clinic.  The target INR is 2.0-3.0.  The next INR is due 04/25/2010.  Anticoagulation instructions were given to patient.  Results were reviewed/authorized by Vashti Hey, RN.  He was notified by Vashti Hey RN.         Prior Anticoagulation Instructions: INR 2.3 Continue coumadin 2.5mg  once daily except 1.25mg  on T,Th  Current Anticoagulation Instructions: INR 2.4 Continue coumadin 2.5mg  once daily except 1.25mg  on T,Th

## 2010-04-01 NOTE — Progress Notes (Addendum)
Summary: Cardiology Phone Note - Refill  Phone Note Call from Patient   Caller: Spouse Summary of Call: Pt's wife called to report that he needs refills of metoprolol & pravastatin. Noted that metoprolol is no longer on his medicine list and was d/c'd in January. I refilled pravastatin but will forward to Dr. Dietrich Pates for review as to whether or not he wanted to refill the metoprolol. Initial call taken by: Ronie Spies PA-C     Appended Document: Cardiology Phone Note - Refill Contact patient, determine accurate med list and return to me for review.  [place] Bing, M.D.  Appended Document: Cardiology Phone Note - Refill warfarin 2.5mg  daily as directed by ccr nurse pravastatin 40mg  daily lisinopril 5mg  daily asa 81mg  daily pacerone 200mg  dialy synthroid daily  Appended Document: Cardiology Phone Note - Refill Renew Pravastatin Do not renew metoprolol.  [place] Bing, M.D.

## 2010-04-10 NOTE — Progress Notes (Signed)
Summary: pt needs to know what rx he was taken off of  Phone Note Call from Patient Call back at Home Phone [phone]   Reason for Call: Refill Medication Summary of Call: pt needs prevastatin and "provalt patient could not give me spelling of this drug" [facility]. He is going out of town [employer] and needs it today. Initial call taken by: Faythe Ghee,  March 28, 2010 3:55 PM  Follow-up for Phone Call        This appears to be a [place] pt, followed by Dr. Dietrich Pates. Cyril Loosen, RN, BSN  March 31, 2010 10:30 AM     Prescriptions: PACERONE 200 MG TABS (AMIODARONE HCL) take 1 tab daily  #30 x 3   Entered by:   Teressa Lower RN   Authorized by:   Kathlen Brunswick, MD, [facility]   Signed by:   Teressa Lower RN on 03/31/2010   Method used:   Electronically to        [facility]  [address] (retail)       [address]       [place], [place]  [address]       Ph: [phone]       Fax: [phone]   RxID:   [id number]

## 2010-04-11 ENCOUNTER — Encounter: Payer: Self-pay | Admitting: Cardiology

## 2010-04-11 DIAGNOSIS — I4892 Unspecified atrial flutter: Secondary | ICD-10-CM

## 2010-04-11 DIAGNOSIS — I4891 Unspecified atrial fibrillation: Secondary | ICD-10-CM

## 2010-04-24 ENCOUNTER — Encounter: Payer: Self-pay | Admitting: Cardiology

## 2010-04-25 ENCOUNTER — Ambulatory Visit (INDEPENDENT_AMBULATORY_CARE_PROVIDER_SITE_OTHER): Payer: BC Managed Care – PPO | Admitting: *Deleted

## 2010-04-25 DIAGNOSIS — I4891 Unspecified atrial fibrillation: Secondary | ICD-10-CM

## 2010-04-25 DIAGNOSIS — I4892 Unspecified atrial flutter: Secondary | ICD-10-CM

## 2010-04-25 LAB — POCT INR: INR: 2.3

## 2010-05-14 ENCOUNTER — Other Ambulatory Visit: Payer: Self-pay

## 2010-05-14 MED ORDER — LEVOTHYROXINE SODIUM 50 MCG PO TABS: 50.0000 ug | ORAL_TABLET | Freq: Every day | ORAL | Status: DC

## 2010-05-20 ENCOUNTER — Encounter: Payer: Self-pay | Admitting: Cardiology

## 2010-05-23 ENCOUNTER — Ambulatory Visit (INDEPENDENT_AMBULATORY_CARE_PROVIDER_SITE_OTHER): Payer: BC Managed Care – PPO | Admitting: Cardiology

## 2010-05-23 ENCOUNTER — Encounter: Payer: Self-pay | Admitting: *Deleted

## 2010-05-23 ENCOUNTER — Ambulatory Visit (INDEPENDENT_AMBULATORY_CARE_PROVIDER_SITE_OTHER): Payer: BC Managed Care – PPO | Admitting: *Deleted

## 2010-05-23 ENCOUNTER — Encounter: Payer: Self-pay | Admitting: Cardiology

## 2010-05-23 DIAGNOSIS — K635 Polyp of colon: Secondary | ICD-10-CM

## 2010-05-23 DIAGNOSIS — Z7901 Long term (current) use of anticoagulants: Secondary | ICD-10-CM | POA: Insufficient documentation

## 2010-05-23 DIAGNOSIS — K2970 Gastritis, unspecified, without bleeding: Secondary | ICD-10-CM

## 2010-05-23 DIAGNOSIS — I10 Essential (primary) hypertension: Secondary | ICD-10-CM

## 2010-05-23 DIAGNOSIS — F17201 Nicotine dependence, unspecified, in remission: Secondary | ICD-10-CM | POA: Insufficient documentation

## 2010-05-23 DIAGNOSIS — I4891 Unspecified atrial fibrillation: Secondary | ICD-10-CM

## 2010-05-23 DIAGNOSIS — Z720 Tobacco use: Secondary | ICD-10-CM

## 2010-05-23 DIAGNOSIS — D126 Benign neoplasm of colon, unspecified: Secondary | ICD-10-CM

## 2010-05-23 DIAGNOSIS — I4892 Unspecified atrial flutter: Secondary | ICD-10-CM

## 2010-05-23 DIAGNOSIS — F17200 Nicotine dependence, unspecified, uncomplicated: Secondary | ICD-10-CM

## 2010-05-23 DIAGNOSIS — I429 Cardiomyopathy, unspecified: Secondary | ICD-10-CM

## 2010-05-23 DIAGNOSIS — E785 Hyperlipidemia, unspecified: Secondary | ICD-10-CM

## 2010-05-23 LAB — POCT INR: INR: 2.3

## 2010-05-23 MED ORDER — ATORVASTATIN CALCIUM 10 MG PO TABS: 5.0000 mg | ORAL_TABLET | Freq: Every day | ORAL | Status: DC

## 2010-05-23 MED ORDER — DILTIAZEM HCL ER COATED BEADS 120 MG PO CP24: 120.0000 mg | ORAL_CAPSULE | Freq: Every day | ORAL | Status: DC

## 2010-05-23 NOTE — Patient Instructions (Addendum)
Your physician recommends that you schedule a follow-up appointment in:2 months Your physician has recommended you make the following change in your medication: stop amiodarone in 12 week on 05/30/2010 start diltiazem 120mg  daily Nurse visit  for vital signs and rhythm strip-3 weeks

## 2010-05-23 NOTE — Progress Notes (Signed)
HPI : [name] Hunter returns to the office as scheduled for continued assessment and treatment of paroxysmal atrial fibrillation.  Since his last visit, he has done fairly well.  He reports no palpitations, no chest discomfort and no dyspnea.  He experiences some easy fatigability, but it sounds as if he is improved relative to his previous office visit.  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  Current Outpatient Prescriptions on File Prior to Visit  Medication Sig Dispense Refill  . aspirin 81 MG tablet Take 81 mg by mouth daily.        Marland Kitchen levothyroxine (SYNTHROID, LEVOTHROID) 50 MCG tablet Take 1 tablet (50 mcg total) by mouth daily.  30 tablet  3  . lisinopril (PRINIVIL,ZESTRIL) 5 MG tablet Take 5 mg by mouth daily.        . pravastatin (PRAVACHOL) 40 MG tablet Take 40 mg by mouth daily.        Marland Kitchen warfarin (COUMADIN) 2.5 MG tablet Take by mouth as directed.        Marland Kitchen DISCONTD: amiodarone (PACERONE) 200 MG tablet Take 200 mg by mouth daily.           No Known Allergies    Past medical history, social history, and family history reviewed and updated.  ROS: Patient denies orthopnea, PND, lightheadedness or syncope.  He has no cold intolerance.  PHYSICAL EXAM: BP 135/78  Pulse 108  Ht 5\' 9"  (1.753 m)  Wt 209 lb (94.802 kg)  BMI 30.86 kg/m2  SpO2 97%  General-Well developed; no acute distress Body habitus-overweight Neck-No JVD; no carotid bruits Lungs-clear lung fields; resonant to percussion Cardiovascular-normal PMI;distant S1 and S2; minimal systolic murmur; irregular rhythm Abdomen-normal bowel sounds; soft and non-tender without masses or organomegaly Musculoskeletal-No deformities, no cyanosis or clubbing Neurologic-Normal cranial nerves; symmetric strength and tone Skin-Warm, no significant lesions Extremities-distal pulses intact; trace edema  EKG (Rhythm Strip): atrial fibrillation with a controlled ventricular response; a  ventricular rate of 92 bpm  ASSESSMENT AND PLAN:

## 2010-05-23 NOTE — Assessment & Plan Note (Signed)
Recurrence of atrial fibrillation while patient is being treated with therapeutic doses of amiodarone, but with no apparent symptoms.  I doubt that sinus rhythm can be maintained without utilizing higher doses of amiodarone or proceeding with pulmonary vein isolation.  In the absence of symptoms related to atrial fibrillation, I do not think that either of these approaches has a desirable risk-benefit ratio.  Accordingly, we will discontinue amiodarone and add diltiazem for control of ventricular rate.  Symptoms and vital signs will be monitored by the cardiology nurses.  I will see this nice gentleman again in 2 months.

## 2010-05-23 NOTE — Patient Instructions (Signed)
Continue coumadin 2.5mg  daily except 1.25mg  on T,Th Recheck in 1 month

## 2010-05-23 NOTE — Assessment & Plan Note (Signed)
INRs have been stable and therapeutic.  We will continue to adjust warfarin dosing in [facility] and to monitor his CBCs and stool Hemoccults to exclude occult GI blood loss.

## 2010-05-23 NOTE — Assessment & Plan Note (Addendum)
Blood pressure control is good and will likely improve further when diltiazem is substituted for amiodarone.

## 2010-05-25 ENCOUNTER — Encounter: Payer: Self-pay | Admitting: Cardiology

## 2010-05-27 NOTE — H&P (Signed)
NAME:  [name] Hunter, [name] Hunter NO.:  [id number]   MEDICAL RECORD NO.:  [id number]          PATIENT TYPE:  EMS   LOCATION:  ED                            FACILITY:  [facility]   PHYSICIAN:  Dorris Singh, DO    DATE OF BIRTH:  1945-04-14   DATE OF ADMISSION:  05/11/2007  DATE OF DISCHARGE:  LH                              HISTORY & PHYSICAL   PRIMARY CARE PHYSICIAN:  Dr. Nobie Putnam.   CHIEF COMPLAINTS:  Tongue swelling.   HISTORY OF PRESENT ILLNESS:  The patient is 65 year old man who  presented to the emergency room with a chief complaint of tongue  swelling.  He stated that it began about 48 hours ago.  He has had this  happen several times in the past.  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  In examining his meals over the last 48 hours,  he did have baked beans which are tomato based, as well as tacos the  night before with tomatoes on them.  He stated that he usually he can  take Benadryl without any problem.  At this time, he noticed that after  taking the Benadryl, his tongue was swollen and then he could not  swallow the pills anymore.  At this point in time, he was having  difficulty speaking, but not any difficulty breathing.  He was then  brought to emergency room for evaluation.   PAST MEDICAL HISTORY:  1. Significant for atrial fib.  2. GERD.  3. Hypercholesterolemia.  4. SVT.   ALLERGIES:  HE HAS NO KNOWN ALLERGIES.   Currently, he smokes cigarette, non-drug abuse.  Occasional drinker.  He  works for a [employer].   MEDICATIONS:  Currently we do not have that listing.   REVIEW OF SYSTEMS:  CONSTITUTIONAL:  Negative for fever or chills.  EYES:  Negative for changes in vision.  The ears, nose, mouth and throat  positive for swelling of the tongue.  Negative for any changes in  hearing or changes in smell.  CARDIOVASCULAR:  Negative for chest pain  or dyspnea.   RESPIRATORY:  Negative for dyspnea, shortness of breath or  wheezing.  GASTROINTESTINAL:  Negative for nausea, vomiting, fever or  chills.  GU:  Negative for dysuria or frequency.  SKIN:  Negative for  rash.   PHYSICAL EXAMINATION:  VITAL SIGNS:  Blood pressure 142/72, pulse rate  72, respirations 20, temperature 98.5, pulse ox.  98%.  GENERAL:  The patient is a well-developed, well-nourished 65 year old  man who looks his stated age in no acute distress.  HEENT:  Head is normocephalic, atraumatic.  Eyes are PERRLA.  EOMI.  His  mouth and tongue is extremely edematous, however, with tongue depression  you can see the oropharynx.  He is able to elevate his oropharynx as  well.  His lips are not swollen, but are slightly edematous, but he  still has difficulty speaking.  NECK:  Supple.  There is some edema in the lower mandible bilaterally,  but he has full range of motion.  HEART:  Regular rate and rhythm.  LUNGS:  Clear to auscultation bilaterally.  No rales, wheezes or  rhonchi.  ABDOMEN:  Soft, nontender, nondistended.  EXTREMITIES:  Positive pulses.  No edema, ecchymosis or cyanosis.  NEURO:  Cranial nerves II-XII grossly intact.  Good motor in all  extremities and good sensation.   LABORATORY DATA:  Labs that were done and tests:  He had a basic  metabolic panel; sodium 143, potassium 3.5, chloride 109, carbon dioxide  30, glucose 126, BUN 7, creatinine 0.86.  His white count is 12.7,  hemoglobin 12.5, hematocrit 35.7 and platelet count of 252.   ASSESSMENT/PLAN:  1. Angioedema of the tongue.  2. Allergic reaction.  3. Elevated blood pressure.  4. History of gastroesophageal reflux disease.   PLAN:  Will admit the patient to the service of [facility] to telemetry  floor.  The patient will be n.p.o. Will place him on high-dose steroids  q.6 h., as well as Benadryl q.4 h., IV.  Will increase his fluid  hydration with IV fluids and will continue to monitor him every 2 hours   with checks to make sure that he is progressing.  Will put O2 on him as  well.  Will hold off on his home medications until he is progressing.  If the patient does start to deteriorate, I have talked to him his wife  extensively about the possibility of intubation.  He is aware that this  may also need to occur.  Will continue to monitor the patient and change  therapy as necessary.      Dorris Singh, DO  Electronically Signed     CB/MEDQ  D:  05/11/2007  T:  05/11/2007  Job:  [id number]   cc:   Patrica Duel, M.D.  Fax: [phone]

## 2010-05-27 NOTE — Discharge Summary (Signed)
NAMEGERMAIN, [name] Hunter             ACCOUNT NO.:  [id number]   MEDICAL RECORD NO.:  [id number]          PATIENT TYPE:  INP   LOCATION:  A214                          FACILITY:  [facility]   PHYSICIAN:  Dorris Singh, DO    DATE OF BIRTH:  Nov 13, 1945   DATE OF ADMISSION:  05/11/2007  DATE OF DISCHARGE:  04/30/2009LH                               DISCHARGE SUMMARY   ADMISSION DIAGNOSES:  Angioedema of the tongue, allergic reaction,  elevated blood pressure, history of gastroesophageal reflex.   DISCHARGE DIAGNOSES:  Angioedema of the tongue resolved, elevated blood  pressure and history of gastroesophageal reflux.  Leukocytosis of  unknown etiology.   PRIMARY CARE PHYSICIAN:  Dr. Nobie Putnam.  He had no radiology tests done  while he was here.   HOSPITAL COURSE:  To summarize, the patient is a 65 year old Caucasian  male who presented to the [facility] emergency room with a 2 day history  of tongue swelling.  The patient has had several bouts of this prior to  and was diagnosed with an allergy to tomatoes.  The patient admits to  eating tacos and having baked beans prior to admission and woke up this  morning with his tongue extremely swollen and tried to swallow Benadryl  but could not.  He presented to the [facility] emergency room and he was  admitted for the above diagnoses.  He was started on steroids as well as  IV Benadryl, IV steroids and IV proton pump inhibitors.  The patient  continued to progress well within the first 6 hours of being admitted  and there was a marked change in the size of his tongue.  He was able to  drink liquids without difficulty.  His diet was advanced for him to be  able to eat.  This morning he was seen.  He was able to talk without any  problems.  He did have a hematoma of his right eye.  The patient states  he had a real bad coughing spell and probably ruptured a blood vessel in  the conjunctiva of his eyes.  This morning his vital signs are stable.  This morning his blood pressure is 117/65.   His labs, has a white count of 15.6 but does not have with a but does  have a left shift.  His hemoglobin is 12.2, hematocrit is 34.3 and his  platelet count is 265.  His chemistry - sodium is 143, potassium 3.5,  chloride 110, CO2 29, glucose 144, BUN 7, creatinine 0.77.  He will be  sent home today on the following medications:   DISCHARGE MEDICATIONS:  1. Simvastatin 20 mg p.o. daily.  2. Omeprazole 20 mg p.o. daily.  3. Fluticasone topical ointment applied to region as needed.  4. Also he was sent home on Allegra 180 mg p.o. daily.  5. Due to his elevated white count we will send him home on Levaquin      500 mg p.o. times 10 days.   PHYSICAL EXAMINATION:  Lungs were clear.  Heart was regular rate and  rhythm.  Tongue was normal size.  He  was able to speak without  difficulty.  Extremities positive pulses, no ecchymosis, edema or  cyanosis.   DISCHARGE INSTRUCTIONS:  Are to increase activity slowly, to follow up  with Dr. Nobie Putnam in 1 week.  He was started on Allegra 1 day and he was  also started on Levaquin 500 mg p.o. times 10 days.  He is to follow up  with his primary care physician and recommend that he goes and sees the  allergist that he saw several years ago, he could not remember the name  at this point in time, that diagnosed him with these tomato allergies to  be retested for further allergies.  The patient stated an understanding  and understands that this reaction could occur very quickly next time he  is exposed to these things and he states understanding to be compliant  with medications and appointments.      Dorris Singh, DO  Electronically Signed     CB/MEDQ  D:  05/12/2007  T:  05/12/2007  Job:  [id number]

## 2010-05-30 ENCOUNTER — Encounter: Payer: Self-pay | Admitting: Cardiology

## 2010-05-30 NOTE — Procedures (Signed)
NAME:  [name] Hunter, [name] Hunter NO.:  [id number]   MEDICAL RECORD NO.:  [id number]          PATIENT TYPE:  OUT   LOCATION:  RAD                           FACILITY:  [facility]   PHYSICIAN:  [place] Bing, M.D.  DATE OF BIRTH:  05-Feb-1945   DATE OF PROCEDURE:  11/07/2003  DATE OF DISCHARGE:                                  ECHOCARDIOGRAM   REFERRING PHYSICIAN:  Drs. Sabas Sous   CLINICAL DATA:  A 65 year old gentleman with arrhythmia and a questionable  prior myocardial infarction.   M-MODE TRACINGS:  1.  Aorta 2.9.  2.  Left atrium 4.2.  3.  Septum 1.5.  4.  Posterior wall 1.3.  5.  LV diastole 4.2.  6.  LV systole 3.1.   IMPRESSION:  1.  A technically adequate echocardiographic study.  2.  Mild left atrial enlargement; normal right atrium and right ventricle.  3.  Minimal aortic valvular sclerosis; mild calcification of the proximal      ascending aorta.  4.  Slight mitral valve thickening; mild annular calcification; slight      coaptation without prolapse.  5.  Left ventricular size at the upper limit of normal; mild LVH;      anteroseptal region relatively hypokinetic compared to other segments.  6.  Overall LV systolic function is low-normal.  7.  Normal pulmonic valve and proximal pulmonary artery.  8.  Normal tricuspid valve with physiologic regurgitation.  9.  Normal IVC.     Robe   RR/MEDQ  D:  11/07/2003  T:  11/07/2003  Job:  [id number]

## 2010-05-30 NOTE — Consult Note (Signed)
NAME:  [name] Hunter, [name] Hunter                   ACCOUNT NO.:  [id number]   MEDICAL RECORD NO.:  [id number]                  PATIENT TYPE:   LOCATION:                                       FACILITY:   PHYSICIAN:  Lionel December, M.D.                 DATE OF BIRTH:  Mar 19, 1945   DATE OF CONSULTATION:  08/15/2002  DATE OF DISCHARGE:                                   CONSULTATION   GASTROINTESTINAL CONSULTATION NOTE   REQUESTING PHYSICIAN:  Corrie Mckusick, M.D.   REASON FOR CONSULTATION:  1. Screening colonoscopy.  2. Abdominal pain bloating.   HISTORY OF PRESENT ILLNESS:  The patient is a 65 year old gentleman, patient  of Dr. Nobie Putnam who presents today for further evaluation for the above  stated symptoms.  The last 3 weeks he has had epigastric discomfort  associated with abdominal bloating.  He denies any nausea or vomiting.  He  occasionally has heartburn symptoms. He denies any dysphagia or odynophagia.  Bowels move every 2 days. Denies any melena or rectal bleeding. He  occasionally takes [facility] powders for headaches.  He usually takes only 1-2  times monthly.   CURRENT MEDICATIONS:  1. Tylenol p.r.n.  2. BC powder, approximately 1-2 per month.   ALLERGIES:  No known drug allergies.   PAST MEDICAL HISTORY:  Negative for chronic illnesses.   PAST SURGICAL HISTORY:  Cataract extraction on the left eye.   FAMILY HISTORY:  Father died of lung cancer.  No family history of  colorectal cancer to his knowledge.   SOCIAL HISTORY:  He is married for 35 years.  Has 2 sons. He is employed  with [employer].  Smoked 4-1/2 pack of cigarettes daily.  He  occasionally consumes beer.   REVIEW OF SYSTEMS:  Please see HPI for GI.  GENERAL:  Denies any weight  loss.  CARDIOPULMONARY:  Denies any chest pain, shortness of breath,  palpitations.   PHYSICAL EXAMINATION:  VITAL SIGNS:  Weight 172. Height 5 feet 8 inches.  Temperature 98.3, blood pressure 110/72, pulse 78.  GENERAL:   A pleasant, well-nourished, well-developed, Caucasian male in no  acute distress.  SKIN:  Warm and dry.  No jaundice.  HEENT:  Conjunctivae are pink.  Sclerae anicteric.  Oropharyngeal mucosa  moist and pink.  No lesions, erythema or exudate.  NECK:  No lymphadenopathy or thyromegaly.  CHEST:  Lungs are clear to auscultation.  CARDIOVASCULAR:  Cardiac exam reveals regular rate and rhythm.  Normal S1,  S2.  No murmurs, rubs, or gallops.  ABDOMEN:  Positive bowel sounds, soft, nondistended.  He has mild epigastric  tenderness to deep palpation. No organomegaly or masses.  No rebound  tenderness or guarding.  EXTREMITIES:  No edema.   IMPRESSION:  This patient is a 65 year old gentleman who has a 3-week  history of vague epigastric discomfort associated with abdominal bloating  and gas as well.  His bowels move only every couple  of days, but denies any  discomfort related to this.  He has never had a colonoscopy.  Recommend one  at this time, primarily for screening purposes.  We discussed the  possibility of upper endoscopy to further evaluate a 3 week history of  epigastric discomfort.  He may have gastritis.  Initially we will give him a  trial of proton pump inhibitor therapy.  If no better at the time of  colonoscopy, to consider upper endoscopy.   PLAN:  1. Colonoscopy plus or minus EGD.  2. Protonix 40 mg p.o. daily, #30 samples provided.     [name] Hunter, P.A.                        Lionel December, M.D.    LL/MEDQ  D:  08/15/2002  T:  08/15/2002  Job:  [id number]   cc:   Patrica Duel, M.D.  [address]  [place]  [place] [address]  Fax: [phone]

## 2010-05-30 NOTE — Op Note (Signed)
NAME:  [name] Hunter, [name] Hunter                       ACCOUNT NO.:  [id number]   MEDICAL RECORD NO.:  [id number]                   PATIENT TYPE:  AMB   LOCATION:  DAY                                  FACILITY:  [facility]   PHYSICIAN:  Lionel December, M.D.                 DATE OF BIRTH:  1945-09-02   DATE OF PROCEDURE:  DATE OF DISCHARGE:                                 OPERATIVE REPORT   PROCEDURE:  Total colonoscopy, polypectomy, followed by diagnostic  esophagogastroduodenoscopy.   ENDOSCOPIST:  Lionel December, M.D.   INDICATIONS:  This patient is a 65 year old Caucasian male who is undergoing  a screening colonoscopy followed by a diagnostic EGD. He has had  intermittent epigastric pain with bloating of a few weeks duration which has  not responded to PPI.   Both the procedures and risks were reviewed with the patient and informed  consent was obtained.   PREOPERATIVE MEDICATIONS:  Cetacaine spray for topical oropharyngeal  anesthesia.  Demerol 50 mg IV and Versed 6 mg IV.   FINDINGS:  Procedure performed in endoscopy suite.  The patient's vital  signs and O2 saturation were monitored during the procedure and remained  stable.   PROCEDURE #1: TOTAL COLONOSCOPY:  Rectal examination was performed.  No  abnormality noted on external or digital exam.  The scope was placed in the  rectum and advanced under vision into the sigmoid colon and beyond.  Preparation was satisfactory.  Scope was passed into the cecum which was  identified by appendiceal orifice and the ileocecal valve.  There was a  small submucosal lesion protruding out of appendiceal orifice with yellowish  discoloration. It had all the features of a small lipoma. It was left alone.  There was a small polyp next to the appendiceal orifice which was ablated by  a cold biopsy.  There was another small, flat polyp at ascending colon which  was partly biopsied and this was coagulated.  There was also a single small  AVM at cecum  which was not bleeding and was left alone.  Two more small  polyps were noted.  One at descending colon and another one at sigmoid  colon.  These were ablated by cold biopsy and submitted in 1 container.  There was an 8 mm polyp at rectum, on a short stalk.  This was snared and  retried for histologic examination.  There was another smaller polyp closer  to it which was coagulated using snare tip.  The dentate line was well seen  on retroflex view and was normal.   The endoscope was withdrawn, and the patient prepared for procedure #2.   PROCEDURE #2: ESOPHAGOGASTRODUODENOSCOPY:  The patient was placed in the  left lateral recumbent position and endoscope was passed via oropharynx  without any difficulty into the esophagus.   ESOPHAGUS:  Mucosa was normal except distally he had a 15 mm long tongue of  Barrett's  mucosa.  The GE junction was markedly serrated.  Pictures were  taken for the record.  No hiatal hernia was noted.   STOMACH:  It was empty and distended very well with insufflation.  The folds  of the proximal stomach were normal.  Examination of the mucosa reveals  antral mucosal granularity and erythema, but no erosions or ulcers noted.  The pyloric channel was patent.  The angularis, fundus, and cardia were  examined by retroflexing the scope and were normal.   DUODENUM:  Examination of the bulb reveals normal mucosa.  The scope was  passed to the second part of the duodenum where the mucosa and folds were  normal.   Endoscope was withdrawn. The patient tolerated the procedures well.   FINAL DIAGNOSES:  1. Small cecal area which was not bleeding and left alone.  2. Small lipoma at appendiceal orifice.  3. Five polyps, the largest one was 8 mm at rectum which was snared.  The     rest were biopsied, but 1 was partly coagulated.  One was at cecum, the     second one was at the ascending colon, and the third one at descending,     and the fourth one was at sigmoid colon.   4. A 15-mm patch of Barrett's-type mucosa.  5. Nonerosive antral gastritis.   RECOMMENDATIONS:  1. Antireflux measures.  He will continue Protonix at 40 mg p.o. q.a.m.  2. H. pylori serology will be checked and he will be scheduled for upper     abdominal pain ultrasound.  I will be contacting the patient with results     of biopsy, blood test and ultrasound.  Given EGD findings, I feel that he     should have EGD in 5 years.                                               Lionel December, M.D.    NR/MEDQ  D:  08/30/2002  T:  08/30/2002  Job:  [id number]   cc:   Corrie Mckusick, M.D.  [address]  [place]  [place] [address]  Fax: [phone]

## 2010-05-30 NOTE — H&P (Signed)
NAME:  JVON, MERONEY NO.:  [id number]   MEDICAL RECORD NO.:  [id number]          PATIENT TYPE:  INP   LOCATION:  2028                         FACILITY:  [facility]   PHYSICIAN:  Doylene Canning. Ladona Ridgel, M.D.  DATE OF BIRTH:  [birth year]   DATE OF ADMISSION:  11/04/2003  DATE OF DISCHARGE:                                HISTORY & PHYSICAL   ADMISSION DIAGNOSIS:  Chest pain and atrial flutter.   CHIEF COMPLAINT:  Chest pain.   HISTORY OF PRESENT ILLNESS:  The patient is a 65 year old male who has been  otherwise healthy in the past.  The patient complained initially of shoulder  pain approximately two weeks ago which initially was treated with a Medrol  Dosepak with improvement. On the day prior to admission, he developed  substernal chest pain associated with neck and left shoulder discomfort and  numbness in his left arm.  The patient also had associated palpitations and  the sensation of rapid heart beat.  He denied associated nausea, vomiting,  diaphoresis, or shortness of breath.  He denied syncope.  He denies cough or  hemoptysis.   He was admitted to the hospital where he was treated with intravenous  calcium channel blockers as well as beta blocker which improved his  ventricular rate which was initially quite fast.  Over the course of  improving rate control, the patient's chest discomfort improved.  Initial  cardiac enzymes were unremarkable.  He was transferred here for additional  evaluation and treatment.   Of note, just prior to transfer, the patient spontaneously converted back to  sinus rhythm.  He was admitted for additional evaluation.   PAST MEDICAL HISTORY:  Negative.   FAMILY HISTORY:  Notable for father dying of cancer but also had a  pacemaker.  He has one brother with a pacemaker.  His mother is still alive  without any major cardiac problems.   SOCIAL HISTORY:  The patient is married and lives in [place].  He still  smokes cigarettes  typically up to two packs per day and has done so most of  his adult life.  He denies alcohol abuse.   REVIEW OF SYMPTOMS:  Negative for fevers, chills, or night sweats.  He  denies any headache, vision, or hearing problems.  He denies any skin rashes  or lesions.  He denies dyspnea, orthopnea, PND, claudication, cough, or  wheezing.  He does have occasional chest pain as noted and palpitations.  He  denies dysuria, hematuria, or nocturia.  He denies any weakness, numbness,  or anxiety.  He denies arthralgias except for right shoulder pain which  occurred two weeks ago and has subsequently resolved.  He denies nausea,  vomiting, diarrhea, or constipation.  He denies polyuria, polydipsia, heat  or cold intolerance, and recent weight changes.   PHYSICAL EXAMINATION:  GENERAL:  He is pleasant, well-appearing middle-aged  man in no distress.  VITAL SIGNS:  Blood pressure is 116/73, pulse was 72 and regular,  respirations were 16.  Temperature was 97.4.  weight was 182 pounds.  HEENT:  Normocephalic and atraumatic.  Pupils were equal  and round.  The  oropharynx was moist.  The sclerae were anicteric.  NECK:  No jugular venous distention.  There was no thyromegaly.  Trachea was  midline.  LUNGS:  Clear bilaterally to auscultation.  There were no wheezes, rales, or  rhonchi.  CARDIOVASCULAR:  Regular rate and rhythm with normal S1 and S2.  ABDOMEN:  Soft, nontender, and nondistended.  There was no organomegaly.  EXTREMITIES:  No clubbing, cyanosis, or edema.  NEUROLOGICAL:  Cranial nerves II through XII grossly intact.  Strength was  5/5 and symmetrical.   LABORATORY DATA:  The EKG demonstrates atrial flutter with a variable AV  conduction followed by the most EKG which demonstrates sinus rhythm,  otherwise normal.   Laboratory evaluation demonstrates cardiac enzymes which were essentially  negative.  His troponin was less than 0.5.  The creatinine and potassium  were within normal limits  as was the hemoglobin.   IMPRESSION:  1.  Atrial flutter.  2.  Chest discomfort.  3.  Multiple cardiac risk factors.   DISPOSITION:  I have discussed the treatment options with the patient and  his wife.  I think proceeding with left heart catheterization is warranted  based on the patient's symptoms.  It may all be that his chest discomfort  was related to atrial flutter and a rapid rate; however, he has multiple  risk factors for coronary disease including ongoing tobacco abuse.  Catheterization will be carried out at the earliest possible convenient  time.  In addition, we will plan on discussing lifestyle and risk factor  modification.  Finally, we will consider a catheter ablation of atrial  flutter, although because he has been not hypertensive if he does not in  fact have coronary disease, then we certainly could consider aspirin therapy  alone at least initially unless he has recurrence of his atrial flutter.       GWT/MEDQ  D:  11/04/2003  T:  11/04/2003  Job:  [id number]   cc:   Patrica Duel, M.D.  [address], [address]  [place]  [place] [address]  Fax: [phone]   Madelin Rear. Sherwood Gambler, MD  [address]  [place]  [place] [address]  Fax: [phone]

## 2010-05-30 NOTE — Discharge Summary (Signed)
NAME:  [name] Hunter, [name] Hunter NO.:  [id number]   MEDICAL RECORD NO.:  [id number]          PATIENT TYPE:  INP   LOCATION:  2028                         FACILITY:  [facility]   PHYSICIAN:  Doylene Canning. Ladona Ridgel, M.D.  DATE OF BIRTH:  25-Sep-1945   DATE OF ADMISSION:  11/04/2003  DATE OF DISCHARGE:  11/05/2003                                 DISCHARGE SUMMARY   PROCEDURES:  1.  Cardiac catheterization.  2.  Coronary arteriogram.  3.  Left ventriculogram.   HOSPITAL COURSE:  [name] Hunter is a 65 year old male with no known history of  coronary artery disease.  He had chest pain and palpitations on the day of  admission and went to [facility].  At [facility], his EKG showed  atrial flutter with a rapid ventricular response.  he was transferred to  [facility] for further evaluation and treatment.   [name] Hunter had Cardizem added to his medication regimen.  He was started on  a low dose at 30 mg q.6h. and tolerated this well.  He converted  spontaneously to sinus rhythm by November 05, 2003.  Cardiac catheterization  was performed on October 24.   The cardiac catheterization showed less than 20 to 30% stenosis of the LAD  and no significant disease in the circumflex, RCA, or ramus.  His EF was  normal.  Dr. Gerri Spore reviewed the films and felt he had mild, non-  significant coronary artery disease, and medical therapy was planned.   Mr.  Hunter was changed from Cardizem CD 30 mg four times a day to Cardizem  CD 120 daily.  He is to start this in a.m.  He is also to stay on a baby  aspirin a day.  Smoking cessation consult was called, and he plans to quit  after discharge.   There was concern for a small hematoma in his groin, and he was examined.  There was  possibly a 1 cm hematoma at his groin, but the area is generally  soft.  No bruits noted.  There is no oozing at he wound site.  [name] Hunter  is pending completion of bed rest, but if he has no problems with  ambulation, he tentatively is considered stable for discharge on November 05, 2003, with outpatient followup arranged.   CONDITION ON DISCHARGE:  Improved.   DISCHARGE DIAGNOSES:  1.  Atrial flutter with rapid ventricular response.  2.  Chest pain, enzymes negative for myocardial infarction, and no      significant nonobstructive coronary artery disease by catheterization      this admission.  3.  History of nonerosive antral gastritis and colon polyps.  4.  History of cataract surgery to his left eye.  5.  Family history of heart rhythm problems requiring pacemaker in his      brother and father.  6.  History of gastrointestinal symptoms including reflux symptoms, nausea,      constipation, and diarrhea.   DISCHARGE INSTRUCTIONS:  1.  His activity level is to include no strenuous activity for two days.  2.  He is to  stick to a low-fat diet.  3.  He is to call the office with problems with the catheterization site.  4.  He is to get a followup appointment and echocardiogram and will be      contacted by our office.   DISCHARGE MEDICATIONS:  1.  Coated aspirin 81 mg daily.  2.  Cardizem CD 120 mg daily.       RB/MEDQ  D:  11/05/2003  T:  11/05/2003  Job:  [id number]   cc:   [place] Bing, M.D.

## 2010-05-30 NOTE — H&P (Signed)
NAME:  [name] Hunter, BURKHALTER NO.:  [id number]   MEDICAL RECORD NO.:  [id number]          PATIENT TYPE:  INP   LOCATION:  A206                          FACILITY:  [facility]   PHYSICIAN:  Patrica Duel, M.D.    DATE OF BIRTH:  Jan 03, 1946   DATE OF ADMISSION:  11/03/2003  DATE OF DISCHARGE:  LH                                HISTORY & PHYSICAL   ca   CHIEF COMPLAINT:  Chest pain.   HISTORY OF PRESENT ILLNESS:  This is a 65 year old male with an essentially  negative past history.   The patient was seen in our office approximately two weeks prior to this  admission complaining of shoulder pain. He was given pain medication. This  did not help his underlying problem, and he presented back to Dr. Sherwood Gambler.  Apparently patient had stiffness in his shoulders and was given a Medrol  dose pack. The patient presented to the emergency department complaining of  a 2-hour episode of substernal chest pain with radiation into the neck. He  denied syncope or diaphoresis or significant dyspnea. He did have  palpitations associated with this.   In the emergency department, the patient was found to have atrial  fibrillation versus coarse atrial fibrillation with a rapid ventricular  response. He was given Lopressor 5 mg IV. Initial cardiac enzymes were  negative. EKG revealed nonspecific ST-T wave changes, particularly  inferiorly. He was admitted for further evaluation and therapy.   The patient's chest pain had resolved spontaneously in the emergency  department. He has not had recurrence of chest pain. Upon my arrival at the  hospital 11 a.m. today, he was walking outside smoking. His rhythm appeared  to be atrial flutter with a rate approximately 150. He currently has no  complaints. The patient was given Cardizem 7.5 mg IV. His heart rate has  come down to approximately 89. Underlying rhythm appears to be atrial  flutter versus coarse atrial fibrillation. Second set of cardiac enzymes  were negative. He remained stable.   After further discussion with the family, it was decided that transfer to  [facility] for further evaluation and therapy is desirable. The  possibility of underlying ischemic disease is not negligible given his  prolonged episode of chest pain prior to his presentation.   There is no history of headache, neurological deficits, nausea, vomiting,  diarrhea, melena, hematemesis, hematochezia, or genitourinary symptoms.   PHYSICAL EXAMINATION:  GENERAL:  This is a very pleasant male in no acute  distress.  VITAL SIGNS:  At presentation, BP was 130/92, heart rate 142. He was  afebrile. Respirations 20 unlabored. Pulse oximetry was 97%.  HEENT:  Normocephalic, atraumatic. Pupils were equal. Ears, nose, and throat  benign.  NECK:  Supple. No bruits, thyromegaly, or lymphadenopathy.  LUNGS:  Clear.  HEART:  Sounds reveal no murmurs, rubs, or gallops. Rhythm is irregularly  irregular.  ABDOMEN:  Nontender, nondistended. Bowel sounds are intact.  EXTREMITIES:  No clubbing, cyanosis, or edema. Peripheral pulses are intact  and full.   NOTE:  Cardiac risk factors:  Family history of arrhythmia in 37 year old  brother, otherwise negative. He is a long term smoker. Lipid status is  unknown. He has a fairly active existence.   ASSESSMENT:  Prolonged episode of chest pain, precipitated or followed by  atrial arrhythmia compatible with atrial fibrillation/flutter, rule out  ischemic etiology.   PLAN:  Transfer to [facility], Dr. Ladona Ridgel has agreed to accept the  patient in transfer for probable cardiac catheterization. Of note, cardizem  drip, Lopressor IV, and heparin have been begun, and he is currently stable  and comfortable with no complaints.     Mark   MC/MEDQ  D:  11/04/2003  T:  11/04/2003  Job:  [id number]

## 2010-05-30 NOTE — H&P (Signed)
NAME:  [name] Hunter, [name] Hunter             ACCOUNT NO.:  [id number]   MEDICAL RECORD NO.:  [id number]          PATIENT TYPE:  AMB   LOCATION:                                FACILITY:  [facility]   PHYSICIAN:  Dalia Heading, M.D.  DATE OF BIRTH:  [birth year]   DATE OF ADMISSION:  06/12/2005  DATE OF DISCHARGE:  LH                                HISTORY & PHYSICAL   CHIEF COMPLAINT:  Right inguinal hernia.   HISTORY OF PRESENT ILLNESS:  Patient is a 65 year old white male who is  referred for evaluation and treatment of a right inguinal hernia.  It has  been present for several months, but is starting to cause discomfort.  No  nausea or vomiting have been noted.   PAST MEDICAL HISTORY:  Atrial fibrillation.  This was seen at an initial  preoperative visit in April 2007.  He was seen by Dr. Graciela Husbands of cardiology  who has treated the patient and now is cleared for surgery.   PAST MEDICAL HISTORY:  As noted above.   PAST SURGICAL HISTORY:  Unremarkable.   CURRENT MEDICATIONS:  A cardiac pill.   ALLERGIES:  No known drug allergies.   REVIEW OF SYSTEMS:  Patient smokes a pack of cigarettes a day.  Denies any  alcohol use.  He denies any other cardiopulmonary difficulties or bleeding  disorders.   PHYSICAL EXAMINATION:  GENERAL:  Patient is a well-developed, well-nourished  white male in no acute distress.  LUNGS:  Clear to auscultation with equal breath sounds bilaterally.  HEART:  Regular rate and rhythm without S3, S4, or murmurs.  ABDOMEN:  Soft, nontender, nondistended.  No hepatosplenomegaly or masses  are noted.  An easily reducible right inguinal hernia is noted.  No left  inguinal hernia is noted.  GENITOURINARY:  Within normal limits.   IMPRESSION:  Right inguinal hernia.   PLAN:  The patient is scheduled for a right inguinal herniorrhaphy on June 12, 2005.  Risks and benefits of the procedure including bleeding, infection,  pain, and recurrence of the hernia were fully explained to  the patient, gave  informed consent.      Dalia Heading, M.D.  Electronically Signed     MAJ/MEDQ  D:  06/05/2005  T:  06/05/2005  Job:  [id number]   cc:   [facility]  Fax: [phone]   Patrica Duel, M.D.  Fax: [phone]

## 2010-05-30 NOTE — Discharge Summary (Signed)
[name], [name] NO.:  [id number]   MEDICAL RECORD NO.:  [id number]          PATIENT TYPE:  OIB   LOCATION:  2023                         FACILITY:  [facility]   PHYSICIAN:  Duke Salvia, M.D.  DATE OF BIRTH:  18-Apr-1945   DATE OF ADMISSION:  05/15/2005  DATE OF DISCHARGE:  05/16/2005                                 DISCHARGE SUMMARY   THIS PATIENT HAS NO KNOWN ALLERGIES TO DRUGS.  HE HAS A FOOD ALLERGY TO  TOMATOES.   PRINCIPLE DIAGNOSES:  1.  Discharging day 1 status post electrophysiology study/radiofrequency      catheter ablation of inducible fibrillation/flutter (cavotricuspid      isthmus block).  2.  Symptomatic recurrent atrial flutter.      1.  Chest discomfort.      2.  Palpitation.      3.  Fatigue and dyspnea.  3.  Left ventricular dysfunction:  Ejection fraction 40-45% by      transesophageal echocardiogram April 27, 2005.  (Recheck echocardiogram in six weeks.  1.  Left heart catheterization November 05, 2003.  Study shows normal left      ventricular systolic function at that time with mild nonsignificant      coronary artery disease.   SECONDARY DIAGNOSES:  1.  Ongoing tobacco.  2.  Hypertension.  3.  Psoriasis.  4.  Awaiting herniorrhaphy.   PROCEDURE:  May 15, 2005, inducible atrial fib flutter at electrophysiology  study, radiofrequency catheter ablation of atrial flutter with creation of  cavotricuspid isthmus block, Dr. Sherryl Manges.  Patient will discontinue  Cardizem and go home on bisoprolol.   HISTORY:  [name] Hunter is a 65 year old male.  He has a known history of  symptomatic atrial flutter.  This was first diagnosed in October, 2005,  after the patient presented with chest discomfort and palpitation.  He  converted to sinus rhythm on Cardizem at that time.  He had a  catheterization on November 05, 2003.  The study showed mild, insignificant  coronary artery disease with normal left ventricular function.  He went home  on  Coumadin.   In mid April of this year he was seen by Dr. Graciela Husbands.  [name] Hunter was worked  up preoperatively for a herniorrhaphy and found to have recurrence of atrial  flutter.  This was an atypical atrial flutter.  He had transesophageal  echocardiogram at that time that showed no left atrial appendage thrombus.  He had DC cardioversion to sinus rhythm April 28, 2005.  His atrial flutter  ablation was postponed secondary to elevated INR (3.2).   He now presents for electrophysiology study radiofrequency catheter  ablation.  He has had his anticoagulation levels checked since April 28, 2005.  On May 11, 2005, his INR was 1.8.  He presented to the office last  Wednesday, May 06, 2005.  At that time his heart rate was elevated, his  Cardizem was increased, I take it three times daily.  I suspect he takes  Cardizem 120 q.8 hours now.  On presentation today, May 15, 2005, he is in  sinus rhythm.  His anticoagulation level on May 13, 2005, was INR of 3.0.  His Coumadin has been on hold May 2 and May 14, 2005.  We will recheck a PT,  INR this morning.  The patient reports no intercurrent chest pain, dyspnea,  palpitations since he last presented to [facility] April 12-17, 2007, he has  had a little fatigue as well.  The risks and benefits of atrial flutter  ablation have been described to the patient, he wishes to proceed.   HOSPITAL COURSE:  The patient presented electively on May 15, 2005.  He  underwent radiofrequency catheter ablation of inducible atrial flutter.  He  has had no recurrence during this hospitalization.  His groins show no  evidence of hematoma.  He is alert and oriented the day of discharge.  He  discharges on enteric coated aspirin 81 mg daily; Coumadin 5 mg given May 15, 2005, then 2.5 mg daily; bisoprolol 5 mg daily, a new medication.  He is to  stop taking Cardizem.  Will also arrange for him to come in the office on  Tuesday, May 19, 2005, for prothrombin time and INR  check.   LABORATORY STUDIES THIS ADMISSION:  White cell count 7.6, hemoglobin 13.3,  hematocrit 39.2, platelets 251.  Serum electrolytes:  Sodium 136, potassium  3.8, chloride 103, carbonate 29, BUN of 7, creatinine 0.9 and glucose 94.      Maple Mirza, P.A.    ______________________________  Duke Salvia, M.D.    GM/MEDQ  D:  05/15/2005  T:  05/17/2005  Job:  [id number]   cc:   Duke Salvia, M.D.  [address]  [address]  [place]  [place] [address]   Patrica Duel, M.D.  Fax: [phone]   [place]a Bing, M.D.  [facility]  [address]  [place], [place] [address]

## 2010-05-30 NOTE — Discharge Summary (Signed)
NAME:  [name] Hunter, [name] Hunter NO.:  [id number]   MEDICAL RECORD NO.:  [id number]          PATIENT TYPE:  OIB   LOCATION:  2023                         FACILITY:  [facility]   PHYSICIAN:  Duke Salvia, M.D.  DATE OF BIRTH:  [birth year]   DATE OF ADMISSION:  05/15/2005  DATE OF DISCHARGE:  05/16/2005                           DISCHARGE SUMMARY - REFERRING   SUMMARY OF HISTORY:  [name] is a 65 year old white male with known  history of atrial flutter that was first diagnosed and October 2005 when he  presented with chest discomfort and palpitations.  He spontaneously  converted to sinus rhythm at that time.  Catheterization showed mild  insignificant disease with normal LV function.  He was discharged on  Coumadin.  In mid-April after being evaluated by Dr. Graciela Husbands for preop  herniorrhaphy, he was having recurrent atrial flutter which was felt to  atypical.  A TEE did not show a left apical thrombus and thus cardioversion  was performed, restoring normal sinus rhythm.  Due to his increased INR,  atrial flutter ablation was postponed  He is readmitted this time for  potential ablation.   LABORATORY:  INR on __________ was 1.2.   HOSPITAL COURSE:  [name] underwent EP study with inducible  fibrillation/flutter which was self-terminating.  Radiofrequency ablation  was performed by Dr. Graciela Husbands.  Post sheath removal and bedrest, the patient  was ambulating without difficulty.  He was complaining of some soreness in  his catheterization site.  Dr. Graciela Husbands after evaluating on May 16, 2005, felt  that the patient could be discharged home.   DISCHARGE DIAGNOSIS:  Recurrent atrial flutter status post radiofrequency of  inducible atrial fibrillation and flutter.   DISPOSITION:  He is restricted in regards to his diet to low sodium, low-  cholesterol.  He was instructed if he plans dental work, even just teeth  cleaning, through October 2007, he was asked to call for antibiotic  coverage.  He was advised no driving, no lifting for two days, given  permission to shower.  If he had any problems with his catheterization site,  he was asked to call.  He was advised to discontinue his Cardizem.  He  received new prescription for bisoprolol 5 mg daily.  He was asked to  continue coated aspirin 81 mg daily and Coumadin 2.5 mg daily every day.  He  was asked to have a PT/INR on the week of May 7, May 8.  The office will  call him.  He will  see Dr. Dietrich Pates on Monday, June 18, at 10:45 a.m. in [place].  An  echocardiogram will be performed on the week of June 11.  The office will  call.  He will see Dr. Graciela Husbands the week of June 18 in the [place] office.   DISCHARGE TIME:  Less than 30 minutes.      Joellyn Rued, P.A. [facility]    ______________________________  Duke Salvia, M.D.    EW/MEDQ  D:  05/16/2005  T:  05/18/2005  Job:  [id number]   cc:   [place]on Bing, M.D. [facility]  [address]  [address]  [place]  [place] [address]   Dr. Caesar Bookman, [place]

## 2010-05-30 NOTE — Procedures (Signed)
NAME:  QUENTEZ, LOBER NO.:  [id number]   MEDICAL RECORD NO.:  [id number]          PATIENT TYPE:  EMS   LOCATION:  ED                            FACILITY:  [facility]   PHYSICIAN:  Edward L. Juanetta Gosling, M.D.DATE OF BIRTH:  1945-05-09   DATE OF PROCEDURE:  04/23/2005  DATE OF DISCHARGE:                                EKG INTERPRETATION   TIME:  0148   INTERPRETATION:  Rhythm appears to be a supraventricular tachycardia with a  rate of about 150.  There is an incomplete right bundle branch block.  Q  waves are seen inferiorly, but with the incomplete right bundle branch  block, it is difficult to say what that means.  ST-T wave abnormalities are  seen which were diffuse, but nonspecific.   IMPRESSION:  Abnormal electrocardiogram.      Edward L. Juanetta Gosling, M.D.  Electronically Signed     ELH/MEDQ  D:  04/26/2005  T:  04/27/2005  Job:  [id number]

## 2010-05-30 NOTE — Op Note (Signed)
NAME:  [name] Hunter, [name] Hunter NO.:  [id number]   MEDICAL RECORD NO.:  [id number]          PATIENT TYPE:  OIB   LOCATION:  2023                         FACILITY:  [facility]   PHYSICIAN:  Duke Salvia, M.D.  DATE OF BIRTH:  21-May-1945   DATE OF PROCEDURE:  05/15/2005  DATE OF DISCHARGE:  05/16/2005                                 OPERATIVE REPORT   PREOPERATIVE DIAGNOSIS:  Congestive failure in the context of atrial flutter  - recurrent.   POSTOPERATIVE DIAGNOSIS:  Congestive failure in the context of atrial  flutter - recurrent.   PROCEDURE:  Invasive electrophysiologic study and arrhythmia mapping.  Radiofrequency catheter ablation.   SURGEON:  Duke Salvia, M.D.   DESCRIPTION OF PROCEDURE:  .  Following the obtainment of informed consent, the patient was brought to the  electrophysiology laboratory and placed on the fluoroscopic table in the  supine position.  After routine prep and drape of the left upper chest,  cardiac catheterization was performed with local anesthesia and conscious  sedation.  Noninvasive blood pressure monitoring, transcutaneous oxygen  saturation monitoring, and end-tidal CO2 monitoring were performed  continuously throughout the procedure.  Following the procedure, the  catheters were removed.  Hemostasis was obtained, and the patient was  transferred to the holding area in stable conditio for removal of his  sheaths.   Surface leads I, aVF and V1 were monitored continuously throughout the  procedure.  Following insertion of the catheters and stimulation protocol  including incremental atrial pacing ,   Incremental ventricular pacing:  Single extra atrial stimuli.  The patient had a cycle length of 600 msec.  I  did find atrial pacing.   RESULTS:  Surface electrocardiogram and basic intervals.  Rhythm:  Sinus.  RR interval:  1013 msec.  P-R interval: Initial is 1160 msec.  Marland Kitchen  QRS duration: Initial is 112 msec.  Q-T interval:   Initial is 456 msec.  P-wave duration:  N/M.  Preexcitation:  Absent.  Bundle branch block:  Absent.  A-H interval:  132 msec.  HV interval is 41 msec.   FINAL:  Rhythm:  Sinus.  RR interval:  968 msec.  PR interval 146 msec.  QRS duration:  112 msec.  QT interval:  409 msec.  P-wave duration:  121 msec.  Bundle branch block:  Absent.  Preexcitation:  Absent.  AH Interval:  154 msec.  HV Interval:  43 msec.   AV nodal Wenckebach cycle length was 400 msec;  VA conduction was  dissociated at 600 msec.   AV nodal effector refractory appeared at a paced cycle length of 600 msec  and 280 msec.   AV nodal conduction was continuous post ablation   Accessory pathway function:  No evidence of accessory pathway was  identified.   Ventricular response to programmed stimulation:  Normal for ventricular  stimulation as noted described above.   Arrhythmias induced:  Atrial fibrillation:  Unfortunately, this was induced  with coronary sinus pacing at the time of caval, tricuspid and isthmus  conduction block.  The rhythm was nonsustained.   Because of this, further  attempts to induce atrial flutter were not  undertaken.  Given the patient's typical atrial flutter by  electrocardiographic criteria, caval tricuspid isthmus  block was then  approached directly.   Radiofrequency energy:  A total of 9 minutes and 52 seconds of RF energy was  applied on three distinct lines across the caval and tricuspid isthmus.  Initially we tried it about 6 o'clock and then moved somewhere more  laterally at the 7 o'clock and ultimately we came back to 5 o'clock.  This  was partly because the coronary sinus os was very anterior and acu tally was  just below the his Bundle catheter.  It was my hope then that by coming up a  little bit more medially that we could get closer to this and this is where  we ended up developing caval tricuspid isthmus conduction block.  This was  bidirectional.   Fl  uroscopy:  A total of 5 minutes and 56 seconds of fluoroscopy time was  utilized at 7.5 _______ per second.   IMPRESSION:  1.  Sinus bradycardia.  2.  Abnormal atrial function manifested by sustained recurrent atrial      flutter and inducible nonsustained atrial fibrillation.  3.  Normal AV nodal function.  4.  Normal His is Purkinje system function.  5.  No accessory pathway.  6.  No ventricular programmed stimulation.   SUMMARY:  The results of the electrophysiology testing confirm the caval,  tricuspid and isthmus conduction which is likely the substraight for the  patient's documented clinical atrial flutter.  Because inducible atrial  arrhythmias consisted only of fibrillation, empiric caval tricuspid versus  isthmus conduction block was accomplished via RF catheter ablation.  This  was accomplished without difficulty without apparent complications.   PLAN:  1.  The patient will want to be observed overnight.  2.  Coumadin will be maintained for 4-6 weeks.  3.  Outpatient echo will reassess ejection fraction in 4-6 weeks.  4.  Calcium blocker will be switched out for a beta blocker.  5.  He will see Dr. Dietrich Pates in about 6 weeks to asses the above and make a      decision at that point to discontinue Coumadin as well as reassessment      of his left ventricular function.           ______________________________  Duke Salvia, M.D.     SCK/MEDQ  D:  05/15/2005  T:  05/17/2005  Job:  [id number]   cc:   Patrica Duel, M.D.  Fax: [phone]   [facility]   Electrophysiology Laboratory

## 2010-05-30 NOTE — Procedures (Signed)
NAME:  VANNA, SHAVERS NO.:  [id number]   MEDICAL RECORD NO.:  [id number]           PATIENT TYPE:   LOCATION:                                FACILITY:  [facility]   PHYSICIAN:  Edward L. Juanetta Gosling, M.D.DATE OF BIRTH:  [birth year]   DATE OF PROCEDURE:  11/04/2003  DATE OF DISCHARGE:                                EKG INTERPRETATION   TIME:  11:31, November 04, 2003.   The rhythm is a supraventricular tachycardia  with a rate of about 150.  There are P waves that are seen in V5 and V6.  There is right axis  deviation.  The computer has called elevated QT interval, and I believe that  is correct with this rapid heart rate.  ST-T abnormalities are seen which  are diffuse but nonspecific.     [place]   ELH/MEDQ  D:  11/10/2003  T:  11/10/2003  Job:  [id number]

## 2010-05-30 NOTE — Cardiovascular Report (Signed)
NAME:  [name], Hunter NO.:  [id number]   MEDICAL RECORD NO.:  [id number]          PATIENT TYPE:  INP   LOCATION:  2028                         FACILITY:  [facility]   PHYSICIAN:  Carole Binning, M.D. LHCDATE OF BIRTH:  March 19, 1945   DATE OF PROCEDURE:  11/05/2003  DATE OF DISCHARGE:                              CARDIAC CATHETERIZATION   PROCEDURE PERFORMED:  Left heart catheterization with coronary angiography  and left ventriculography.   INDICATION:  Mr. Knoble is a 65 year old male who presented to the hospital  with chest pain and atrial flutter.  He ruled out for myocardial infarction,  was referred for cardiac catheterization to rule out coronary artery  disease.   PROCEDURAL NOTE:  A 6 French sheath was placed in the right femoral artery.  Coronary angiography was performed with standard Judkins 6 French catheters.  A left ventriculography was performed with an angled pigtail catheter.  Contrast was Omnipaque.  There were no complications.   RESULTS:   HEMODYNAMICS:  Left ventricular pressure 148/22, aortic pressure 142/82.  There was not a significant aortic valve gradient on catheter pullback.   LEFT VENTRICULOGRAM:  Wall motion is normal, ejection fraction estimated at  60%.  Mitral regurgitation was present, but most likely secondary to  ventricular ectopy.   CORONARY ARTERIOGRAPHY (RIGHT DOMINANT):  Left main is normal.   Left anterior descending artery has a 20% stenosis in the ostium and a 30%  stenosis in the mid vessel.  The LAD gives rise to a small first diagonal  and a normal sized second diagonal.   Left circumflex gives rise to a normal sized first obtuse marginal and a  small second obtuse marginal branch.  The left circumflex is normal.   Right coronary artery gives rise to a normal sized posterior descending  artery, a small first posterolateral branch and a normal sized second  posterolateral branch.  The right coronary artery is  normal.   IMPRESSIONS:  1.  Normal left ventricular systolic function.  2.  Mild nonsignificant coronary artery disease.   RECOMMENDATION:  For medical therapy and further management of arrhythmia  per Dr. Ladona Ridgel.       MWP/MEDQ  D:  11/05/2003  T:  11/05/2003  Job:  [id number]   cc:   Madelin Rear. Sherwood Gambler, MD  [address]  [place]  [place] [address]  Fax: [phone]   Doylene Canning. Ladona Ridgel, M.D.   Cardiac Cath Lab

## 2010-05-30 NOTE — Consult Note (Signed)
NAME:  [name] Hunter, SWALLOWS NO.:  [id number]   MEDICAL RECORD NO.:  [id number]          PATIENT TYPE:  INP   LOCATION:  2020                         FACILITY:  [facility]   PHYSICIAN:  Duke Salvia, M.D.  DATE OF BIRTH:  May 15, 1945   DATE OF CONSULTATION:  04/25/2005  DATE OF DISCHARGE:                                   CONSULTATION   REASON FOR CONSULTATION:  Thank you very much for asking me to see [name] Hunter in consultation because of atrial flutter.   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 He went for a hernia repair and was found to  have a heart rate of 150. He was referred for electrocardiogram,  demonstrating atrial flutter with rapid ventricular response and was  subsequently referred to [facility] for further treatment. We are  asked to see him to consider our catheter ablation with primary therapy. His  thromboembolic risk factors are notable for possible LV dysfunction.  Otherwise, they are negative for hypertension, diabetes, or prior stroke,  congestive heart failure. His cardiac review of systems is similarly  negative for chest pain. He also does not have claudication, I think,  although he said that one time he got cramps in his legs with exercising and  the other times he said he did not. He does smoke.   PAST MEDICAL HISTORY:  1.  Psoriasis.  2.  Nephrolithiasis.   REVIEW OF SYSTEMS:  Broadly negative for all other organ systems, including  constitutional symptoms.   PAST SURGICAL HISTORY:  Negative.   SOCIAL HISTORY:  He is married. He has 2 children. He smokes a pack per day.  He drinks occasional alcohol. Does not use recreational drugs.   FAMILY HISTORY:  Noncontributory.   PHYSICAL EXAMINATION:  VITAL SIGNS:  I should note that his blood pressures  have been elevated in the emergency  room but since he has been in the  hospital on Diltiazem, they have been normal, and are most recently 103/65.  His pulse is 75 and irregular.  HEENT:  No icterus, no xanthoma.  NECK:  Veins were flat. Carotids were brisk and full bilaterally without  bruits.  BACK:  Without kyphosis or scoliosis.  LUNGS:  Clear.  HEART:  Sounds were irregular without murmur, rub, or gallop.  ABDOMEN:  Soft with active bowel sounds, without midline pulsation or  hepatosplenomegaly.  EXTREMITIES:  Femoral pulses were 2+, distal pulses are intact.  SKIN:  Warm and dry with diffuse psoriatic plaquing over his lower  extremities. Extremities were without edema.  NEUROLOGIC:  Examination was grossly normal.   LABORATORY DATA:  Electrocardiogram dated April 23, 2005 demonstrated atrial  flutter that was somewhat typical. It has a negative pattern in the inferior  leads but in lead V1, instead of discrete flutter waves, there is quite a  rounded flutter wave. The ECG's that we have from the rhythm strip at [facility] demonstrates a more discrete  inferior flutter wave in lead  MCL.   Labs are notable for normal renal function, mild anemia, low HDL.   CURRENT MEDICATIONS:  Include heparin, IV Diltiazem, oral Coumadin.   ALLERGIES:  NO KNOWN DRUG ALLERGIES.   IMPRESSION:  1.  Atrial flutter, atypical, without symptoms with the atrial cycle going      from about 200 milliseconds.  2.  Thromboembolic risk factors notable for:      1.  Question hypertension, evident on admission.      2.  Question left ventricular dysfunction with a__________ scoreof zero          to 2.  3.  Psoriasis.  4.  Smoking.   DISCUSSION:  [name] has atrial flutter with a__________ scorescore of  between zero and 2. His thromboembolic risks would be estimated to be  between 2% and 4%. Procedural catastrophic risk with an atrial flutter  ablation is about 1 in 1,000 and thus, I would risk a __________ of catheter   ablation as primary therapy. His atrial flutter is atypical, based on leads  V1 and so, the success rates in this population may be as low as 65% to 70%.   PLAN:  Given schedule constraints, I would recommend proceeding with:  1.  TEE on Monday morning.  2.  In the event that the schedule permits, we can proceed with catheter      ablation later Monday morning. If not, I would undertake Cardioversion      at the time of his transesophageal echocardiogram.  3.  Followup of blood pressure and consider an ARB.  4.  Smoking cessation.  5.  Continue Coumadin, as currently.   Thank you for this consultation.           ______________________________  Duke Salvia, M.D.     SCK/MEDQ  D:  04/25/2005  T:  04/25/2005  Job:  [id number]   cc:    Bing, M.D. [facility]  [address]  [address]  [place]  [place] [address]   Madelin Rear. Sherwood Gambler, MD  Fax: [phone]   Electrophysiology Laboratory

## 2010-05-30 NOTE — Procedures (Signed)
NAME:  NASIER, THUMM NO.:  [id number]   MEDICAL RECORD NO.:  [id number]          PATIENT TYPE:  INP   LOCATION:  2028                         FACILITY:  [facility]   PHYSICIAN:  Edward L. Juanetta Gosling, M.D.DATE OF BIRTH:  1945/02/20   DATE OF PROCEDURE:  11/04/2003  DATE OF DISCHARGE:  11/05/2003                                EKG INTERPRETATION   RESULTS:  The rhythm appears to be an atrial flutter with variable block.     Edwa   ELH/MEDQ  D:  11/07/2003  T:  11/07/2003  Job:  [id number]

## 2010-05-30 NOTE — Procedures (Signed)
NAME:  [name] Hunter, [name] Hunter NO.:  [id number]   MEDICAL RECORD NO.:  [id number]          PATIENT TYPE:  OUT   LOCATION:  RAD                           FACILITY:  [facility]   PHYSICIAN:  Vida Roller, M.D.   DATE OF BIRTH:  1945-01-29   DATE OF PROCEDURE:  07/14/2005  DATE OF DISCHARGE:                                  ECHOCARDIOGRAM   PRIMARY CARE PHYSICIAN:  Madelin Rear. Sherwood Gambler, M.D.   TAPE NUMBER:  [id number].   TAPE COUNT:  [id number] through [id number].   INDICATIONS FOR PROCEDURE:  The patient is a 65 year old man for LV systolic  function.  Previous echocardiogram in October of 2005 showed normal left  ventricular systolic function, anterior septal hypokinesis, no significant  valvular heart disease.   PROCEDURE:  Today's study is technically adequate.   M-MODE TRACINGS:  The aorta is 27 mm.   The left atrium is 40 mm.   The septum is 12 mm.   The posterior wall is 12 mm.   The diastolic dimension is 44 mm.   The systolic dimension is 32 mm.   2-D AND DOPPLER IMAGING:  The left ventricle is normal size.  There is  preserved LV systolic function which is low-normal at 50-55%.  There is mild  concentric left ventricular hypertrophy.  There is mild mid to distal  anteroseptal hypokinesis.   The right ventricle is normal size with normal systolic function.   There is mild left atrial enlargement.   The aortic valve is mildly sclerotic with no stenosis or regurgitation.   The mitral valve has mild annular calcification with trivial regurgitation.   The tricuspid valve has mild regurgitation.   There is no pericardial effusion.      Vida Roller, M.D.  Electronically Signed     JH/MEDQ  D:  07/14/2005  T:  07/14/2005  Job:  [id number]   cc:   Madelin Rear. Sherwood Gambler, MD  Fax: [phone]

## 2010-05-30 NOTE — Discharge Summary (Signed)
NAMETALIB, HEADLEY NO.:  [id number]   MEDICAL RECORD NO.:  [id number]          PATIENT TYPE:  INP   LOCATION:  2020                         FACILITY:  [facility]   PHYSICIAN:  [place] Bing, M.D. [facility] OF BIRTH:  Sep 20, 1945   DATE OF ADMISSION:  04/23/2005  DATE OF DISCHARGE:  04/28/2005                                 DISCHARGE SUMMARY   ADDENDUM   PRIMARY CARE PHYSICIAN:  Patrica Duel, M.D.   ALLERGIES:  No known drug allergies.  He does have an allergy to TOMATOES.   DISCHARGE DIAGNOSES:  1.  Atrial flutter, rapid ventricular rate.  2.  Atrial flutter on preoperative evaluation for herniorrhaphy.  3.  Symptomatic atrial flutter with fatigue.  4.  History of atrial flutter approximately 1 year ago with spontaneous      conversion.  5.  Started Coumadin therapy this hospitalization.  6.  Transesophageal echocardiogram, ejection fraction 40-45%, but heart rate      elevated.  Repeat two-dimensional echocardiogram in sinus rhythm.  7.  No left atrial appendage thrombus, normal aortic valve, mild mitral      regurgitation, trace tricuspid regurgitation.  8.  DC cardioversion to sinus rhythm on April 28, 2005.  9.  Planning atrial flutter ablation after 4 weeks of therapeutic Coumadin.   SECONDARY DIAGNOSES:  1.  Ongoing tobacco habituation.  2.  Hypertension.  3.  Psoriasis.  4.  Awaiting inguinal herniorrhaphy.   PROCEDURE:  1.  April 27, 2005, transesophageal echocardiogram.  This study showed      ejection fraction of 40-45%, no left atrial appendage thrombus.  2.  April 28, 2005, DC cardioversion to sinus rhythm.  100 joules energy      applied.  3.  Planned atrial flutter ablation was canceled due to supratherapeutic INR      of 3.2 on April 17.   BRIEF HISTORY:  Mr. Cruise is a 65 year old male who comes to [facility] from [facility].  He has been found to be in  rapid atrial flutter.  This is an atypical form of  atrial flutter.  This was  discovered in the preoperative workup for herniorrhaphy.   The patient has been fatigued for the last few weeks, actually sleeping  quite a bit. The patient has not complained of chest pain, shortness of  breath, nausea, vomiting, or diarrhea.  The patient is not really aware of  tachy palpitations.  The patient is not on any regular medication therapy at  home.  The plan on this admission will be for rate control.  He will be  initiated on anticoagulation with Coumadin.  He will have a transesophageal  echocardiogram and if there is no left atrial appendage thrombus, he will  have atrial flutter ablation.   HOSPITAL COURSE:  Mr. Perezperez transferred from [facility]  to [facility] on April 12, with the finding of atrial flutter,  rapid ventricular rate, symptomatic in that he has fatigue.  The patient was  started on IV heparin and Coumadin at the time of transfer.  He had a  tobacco cessation consult.  As a result of the consult, the patient says he  is willing to quit and to try Chantix.  This could possibly be started as an  outpatient.  Unfortunately, his wife also smokes and she says she is  unwilling to quit.  This may make his decision to stop smoking all the  harder.  He was also seen this hospitalization by Duke Salvia, M.D.,  electrophysiologist.  The patient does have thromboembolic risk factors  which would necessitate continuing anticoagulation.  The plan would be  transesophageal echocardiogram with DC cardioversion to follow with  radiofrequency catheter ablation of atrial flutter.  Transesophageal  echocardiogram was done on April 16, and showed no evidence of left atrial  appendage thrombus.  The patient was scheduled for the procedure, the  following day, April 17.  By April 16, the patient's INR was therapeutic,  but on April 17, it was supratherapeutic at 3.2.  The ablation procedure had  to be canceled.   Cardioversion was performed and the patient converted to  sinus rhythm.  The patient will discharge in sinus rhythm.  He does home on  Coumadin 1/2 tablet 2.5 mg daily, also aspirin 81 mg daily, and Cardizem 240  mg tabs 1-1/2 tabs daily or 360 mg daily.   He has follow-up at the [facility] on  Wednesday, April 18, at 11:45, Wednesday, April 25, at 2:30, and Wednesday,  May 2, at 9 o'clock.  He presents to the [facility] Cardiology Office  [place] on Thursday, May 3, at 2:45 p.m.  At that time, coagulation  studies will be reviewed as well as electrocardiogram which will be taken at  each of the meetings in [place].  The patient will then be scheduled for  elective atrial flutter ablation at [facility].   DISCHARGE LABORATORY DATA:  Complete blood count; white cells 9, hemoglobin  13.6, hematocrit 40.6, platelets 234.  His last serum electrolytes were on  April 13; sodium 139, potassium 3.6, chloride 107, bicarbonate 28, BUN 10,  creatinine 0.9, and glucose 108.  The day of discharge, his PT was 32.4, INR  3.2, troponin I studies on admission were 0.01 and 0.01.  TSH this admission  was 3.238.      Maple Mirza, P.A.      [place] Bing, M.D. [facility]  Electronically Signed    GM/MEDQ  D:  04/28/2005  T:  04/29/2005  Job:  [id number]   cc:   Patrica Duel, M.D.  Fax: [phone]   Duke Salvia, M.D.  [address]  [address]  [place]  [place] [address]

## 2010-05-30 NOTE — Letter (Signed)
September 25, 2005     Patrica Duel, M.D.  [address], [address]  [place], [place] [address]   RE:  MYKA, LUKINS  MRN:  [id number]  /  DOB:  1945-08-12   Dear Loraine Leriche:   Mr. Beckom returns to the office, now 4 months following radiofrequency  ablation of atrial flutter.  He has had no palpitations nor other  cardiopulmonary symptoms.  He feels entirely well.  His only medication is  Coumadin, with which he has maintained stable and therapeutic  anticoagulation.   PHYSICAL EXAMINATION:  GENERAL:  A pleasant, trim gentleman in no acute  distress.  VITAL SIGNS:  Weight is 186, 2 pounds more than in 2005.  Blood pressure  110/75, heart rate 75 and regular, respirations 16.  Occasional premature  detected.  NECK:  No jugular venous distention.  LUNGS:  Clear.  CARDIAC:  Distant first and second heart sounds.  EXTREMITIES:  No edema.   RHYTHM STRIP:  Normal sinus rhythm.  Normal intervals.   IMPRESSION:  Mr. Agena is doing extremely well following a radiofrequency  ablation procedure.  Even if he has recurrent atrial arrhythmias, his risk  of thromboembolism is low.   Accordingly, warfarin will be discontinued.  He does not require routine  cardiology followup.  Please let me know at any time that I can assist with  his care.    Sincerely,      Gerrit Friends. Dietrich Pates, MD, [facility]   RMR/MedQ  DD:  09/25/2005  DT:  09/26/2005  Job #:  [id number]

## 2010-05-30 NOTE — Procedures (Signed)
NAME:  [name], Hunter NO.:  [id number]   MEDICAL RECORD NO.:  [id number]          PATIENT TYPE:  EMS   LOCATION:  ED                            FACILITY:  [facility]   PHYSICIAN:  Edward L. Juanetta Gosling, M.D.DATE OF BIRTH:  1945/11/19   DATE OF PROCEDURE:  04/23/2005  DATE OF DISCHARGE:                                EKG INTERPRETATION   TIME AND DATE OF STUDY:  1409, April 23, 2005.   The rhythm appears to be a mostly regular tachycardia, but there are areas  where it is slightly irregular, a supraventricular tachycardia with a rate  of about 150.  This may be an atrial flutter/fibrillation.  There is right  axis deviation and what appears to be a right bundle branch block.  Abnormal  electrocardiogram.      Oneal Deputy. Juanetta Gosling, M.D.  Electronically Signed     ELH/MEDQ  D:  04/26/2005  T:  04/27/2005  Job:  [id number]

## 2010-05-30 NOTE — Procedures (Signed)
NAME:  [name] Hunter, [name] Hunter             ACCOUNT NO.:  [id number]   MEDICAL RECORD NO.:  [id number]          PATIENT TYPE:  OUT   LOCATION:                                FACILITY:  [facility]   PHYSICIAN:  Edward L. Juanetta Gosling, M.D.DATE OF BIRTH:  23-Mar-1945   DATE OF PROCEDURE:  DATE OF DISCHARGE:                                EKG INTERPRETATION   RESULTS:  The computer says the rhythm is atrial flutter with a variable AV  block which may be the case.  Certainly, this is a supraventricular  tachycardia.  I do not see definite flutter waves, so I am not certain that  this indeed atrial flutter with variable AV block.  There is ST-T wave  abnormality which is diffuse and may be related to the use of digitalis.  T-  wave abnormalities are seen which are nonspecific.     Edwa   ELH/MEDQ  D:  11/06/2003  T:  11/06/2003  Job:  [id number]

## 2010-05-30 NOTE — H&P (Signed)
NAME:  [name] Hunter, [name] Hunter NO.:  [id number]   MEDICAL RECORD NO.:  [id number]           PATIENT TYPE:   LOCATION:                                 FACILITY:   PHYSICIAN:  Rollene Rotunda, M.D.        DATE OF BIRTH:   DATE OF ADMISSION:  DATE OF DISCHARGE:                                HISTORY & PHYSICAL   CARDIOLOGIST:  Dr. Dietrich Pates   ADMITTING CARDIOLOGIST:  Dr. Antoine Poche   PRIMARY CARE PHYSICIAN:  Dr. Elfredia Nevins   CHIEF COMPLAINT:  Atrial flutter.   HISTORY OF PRESENT ILLNESS:  This is a 65 year old gentleman that was  transferred down from [facility] this afternoon with uncontrolled rapid  atrial flutter.  Patient had presented to [facility] for a preoperative  clearance for hernia repair that was scheduled for the a.m. tomorrow.  Patient states that he has been very fatigued for the last few weeks and has  been sleeping a lot, however, has no complaints of any chest pain, shortness  of breath, no nausea, vomiting, or diarrhea.  Patient had a previous history  of atrial flutter approximately one year ago which had converted  spontaneously.  Cardiac catheterization was done which showed non-  obstructive coronary artery disease at that time.  Patient has not taken any  of his medications that were prescribed for him in 2005 which were aspirin  and cortisone 180.  Patient's only medications at home have been prednisone  p.r.n. for a tomato allergy.   PAST MEDICAL HISTORY:  History of atrial flutter with a __________ block  approximately one year ago.  An echocardiogram in 2005 showed a low normal  ejection fraction.   ALLERGIES:  TOMATOES.  No known drug allergies.   MEDICATIONS:  Prednisone p.r.n. for tomato allergy.   SOCIAL HISTORY:  Patient lives in [place] with his spouse.  He is in  heating and air conditioning.  He is married with two children.  He smokes a  pack per day for approximately 30 years.  He drinks no alcohol.  His diet is  regular.   Only exercise he gets is his work.   FAMILY HISTORY:  His mother is alive at age 20 with diabetes mellitus.  Father is deceased at age 57 with lung cancer and heart trouble.  Siblings:  He has three sisters and one brother.  One brother has a pacemaker.   REVIEW OF SYSTEMS:  Negative for fevers, chills, sweats, or weight changes.  HEENT:  Positive for headaches.  Negative for vertigo, photophobia, vision  changes.  SKIN:  He has psoriasis on multiple areas on his body.  CARDIOPULMONARY:  He complains of cough and wheezing.  No chest pain.  No  shortness of breath, dyspnea on exertion.  No orthopnea, PND, __________,  palpitation, presyncope, or claudication.  GU:  Negative for frequency,  urination, hematuria, nocturia.  NEURO/PSYCH:  He denies weakness, numbness,  depression, or anxiety.  MUSCULOSKELETAL:  Negative for arthralgia, joint  pain, deformity.  GI:  Negative for nausea, diarrhea, melena, hematemesis.  He does have occasional GERD symptoms.  He does have abdominal pain in the  lower abdomen for the past two to three weeks.  All other systems are  negative except as noted.   PHYSICAL EXAMINATION:  VITAL SIGNS:  Temperature 98.1, pulse 103,  respiratory rate 22, blood pressure 158/98 and 140/93, saturation 99% on 2  L.  GENERAL:  He is in no acute distress.  HEENT:  Pupils are equal, round, and reactive to light.  EOIM.  Sclerae  clear.  Nares without discharge.  Dentition:  He has upper and lower  dentures.  OP without erythema or exudates.  NECK:  Supple without lymphadenopathy, TMJ, bruit, or JVD.  LYMPHADENOPATHY:  None.  CARDIOVASCULAR:  Irregular rate.  S1, S2.  Pulses are 2+ and equal.  No  crackles.  LUNGS:  No wheezes in his lungs.  Clear to auscultation bilaterally.  SKIN:  He has multiple areas of psoriasis and areas on his body.  ABDOMEN:  Soft, nontender.  No acute active bowel sounds.  No rebound.  No  guarding.  No organomegaly.  EXTREMITIES:  No edema.  2+  pulses.  No clubbing, cyanosis, edema, rashes,  or lesions.  MUSCULOSKELETAL:  No joint deformity, effusions.  No spine, CVA tenderness.  NEUROLOGIC:  Alert and oriented x3.  Cranial nerves II-XII grossly intact.  Strength 5/5 all extremities in axial groups.  Normal sensation throughout.   Chest x-ray stable.  No acute disease.  Mild CE.  EKG is atrial flutter with  a rate of 107 with a right axis.   LABORATORIES:  White blood cell count of 7.7, hemoglobin 13.1, hematocrit  38.6, platelet count of 218.   ASSESSMENT/PLAN:  1.  Dr. Antoine Poche in to see and examine patient.  Patient does have atrial      flutter with no symptoms, questionable mildly reduced ejection fraction      in the past.  Patient was placed on aspirin 81 mg a day and Cardizem      intravenous drip.  Per Dr. Antoine Poche would consider DCCV cardioversion if      still in atrial flutter in a.m.  We do not know the desaturation and      although the low risk for a clot we will place the patient on heparin.      Dr. Antoine Poche also suggests a TEE/cardioversion.  Would then refer to EP      for discussion of flutter ablation.  Again, will place the patient on      intravenous heparin and intravenous diltiazem tonight.  2.  Hypertension.  Again, hopefully this will respond to his cortisone.     ______________________________  April Humphrey, NP    ______________________________  Rollene Rotunda, M.D.    AH/MEDQ  D:  04/23/2005  T:  04/23/2005  Job:  [id number]

## 2010-05-30 NOTE — H&P (Signed)
NAME:  [name] Hunter, [name] Hunter NO.:  [id number]   MEDICAL RECORD NO.:  [id number]          PATIENT TYPE:  OIB   LOCATION:  2023                         FACILITY:  [facility]   PHYSICIAN:  Duke Salvia, M.D.  DATE OF BIRTH:  [birth year]   DATE OF ADMISSION:  05/15/2005  DATE OF DISCHARGE:                                HISTORY & PHYSICAL   ELECTROPHYSIOLOGIST:  Dr. Sherryl Manges.   CARDIOLOGIST:  Dr. Dietrich Pates.  He sees him in [place], [facility].   FAMILY CAREGIVER:  Dr. Nobie Putnam.   ALLERGIES:  NO KNOWN DRUG ALLERGIES but he does have an allergy to TOMATOES.   PRESENTING CIRCUMSTANCE:  I am here again to try for the cure.   HISTORY OF PRESENT ILLNESS:  [name] Hunter is a 65 year old male with known  history of symptomatic atrial flutter. This was first diagnosed in October  2005 after the patient presented with chest discomfort and palpitation. He  converted to sinus rhythm on Cardizem at that time. He had catheterization  November 05, 2003. The study demonstrated mild, insignificant coronary artery  disease with normal left ventricular function. He went home on Coumadin.   In mid April of this year, he was seen by Dr. Graciela Husbands. Mr. Teems was  referred after he was seen preoperatively for a herniorrhaphy. He was found  to have recurrence of his atrial flutter. This is an atypical atrial  flutter. He had transesophageal echocardiogram at that time, which showed no  left atrial appendage thrombus. He had DCCV, which returned the patient to  sinus rhythm on April 28, 2005. Atrial flutter ablation was planned but  postponed due to an elevated INR. It was 3.2.   The patient has had 4 weeks of Coumadin therapy. He now returns for  electrophysiology study, radio-frequency catheter ablation. He has had his  anticoagulation levels checked since April 17 discharge from [facility]. On April 30, his INR was 1.8. He presented for our office visit  last Wednesday,  April 25th. At that time his heart rate was elevated. His  Cardizem was increased, I take it 3 times daily. I suspect that he takes  Cardizem 120 mg every 8 hours now. On presentation today, he is in sinus  rhythm. His INR on May 2 at [facility] was 3.0 and he has  held his Coumadin on both May 2nd and May 3rd, Wednesday and Thursday of  this week. We will check the PT/INR this morning. The patient is in a  regular rhythm on examination today. He reports no chest pain, no dyspnea,  no palpitation and little fatigue since his last presentation at [facility],  April 12th to April 17th.   MEDICATIONS:  1.  Coumadin 2.5 mg daily. Once again held on May 2nd and May 3rd.  2.  Cardizem 120 mg p.o. t.i.d.  3.  Aspirin 81 mg daily.   SOCIAL HISTORY:  The patient lives in [place] with his wife. He is  currently employed in the heating and air conditioning business and works in  Holiday [employer]. Tobacco, still smoking 3 to 4 cigarettes  a day. Occasional  alcoholic beverages.   FAMILY HISTORY:  Mother living with diabetes. Father died of lung cancer. He  has 2 brothers and 3 sisters. One brother has a pacemaker. No significant  history of coronary artery disease in the family.   REVIEW OF SYSTEMS:  The patient is denying fevers, chills, night sweats, or  weight change. HEENT:  No nasal discharge, epistaxis, hoarseness, voice  change, or vertigo. INTEGUMENT:  No rashes or non-healing ulcerations.  CARDIOPULMONARY:  No chest pain. No dyspnea. No dyspnea on exertion or  orthopnea. No paroxysmal nocturnal dyspnea. He and the wife both admit that  he has edema after standing during the day. No syncope or pre-syncope. No  claudication. No palpitation. GENITOURINARY:  The patient gets up at night  to urinate one to two times. No dysuria. No hematuria.  NEUROLOGIC/PSYCHIATRIC:  No weakness, numbness, depression, or anxiety.  GASTROINTESTINAL:  No history of heartburn. No history of GI  bleeding.  ENDOCRINE:  No diabetes or thyroid problems. MUSCULOSKELETAL:  No complaints  of arthralgias or joint effusions. All other systems are negative.   PHYSICAL EXAMINATION:  VITAL SIGNS:  Temperature 97.6, pulse 65 and regular,  blood pressure 135/74, respiratory rate 20. Oxygen saturation 99% on room  air.  GENERAL:  Alert and oriented times three. Well nourished. In no acute  distress.  HEENT:  Pupils are equal, round, and reactive to light. Extraocular  movements intact. Sclerae anicteric. Nares without discharge.  NECK:  Supple. No carotid bruits auscultated. No thyromegaly. No jugular  venous distention.  HEART:  Regular rate and rhythm. Without murmur.  LUNGS:  Slight wheezes in the upper right lung fields. Otherwise, clear to  auscultation.  ABDOMEN:  No distention. Bowel sounds present. No hepatosplenomegaly.  Abdominal aorta is non-pulsatile.  EXTREMITIES:  No evidence of clubbing, cyanosis, or edema. Dorsalis pedis  pulses are bounding 4 over 4 bilaterally. Radial pulses 4 over 4  bilaterally.  MUSCULOSKELETAL:  No joint deformity or effusions.  NEUROLOGIC:  No neurologic deficits noted.   LABORATORY DATA:  Electrocardiogram taken May 13, 2005 shows sinus rhythm  with QRS, equal to or less than 100 milliseconds.   Chest x-ray taken April 23, 2005, mild cardiomegaly. No active lung disease.   Studies on May 13, 2005. Complete blood count:  White cells 7.6. Hemoglobin  and hematocrit 13.5 and 39.2. Platelets 251,000. Serum electrolytes:  Sodium  136, potassium 3.8, chloride 103, carbonate 29, BUN 7, creatinine 0.9,  glucose 94. PT and INR will be drawn today, May 4th.   IMPRESSION:  1.  Recurrent atypical atrial flutter, the patient on Coumadin.  2.  Symptomatic with atrial flutter, fatigue, and dyspnea.  3.  Ongoing tobacco habituation.  4.  Hypertension.  5.  Psoriasis.  6.  Awaiting herniorrhaphy.  PLAN:  Electrophysiology study, radio-frequency catheter  ablation, Dr.  Graciela Husbands.      Maple Mirza, P.A.    ______________________________  Duke Salvia, M.D.    GM/MEDQ  D:  05/15/2005  T:  05/16/2005  Job:  [id number]   cc:   Lakeview Bing, M.D. [facility]  [address]  [address]  [place]  [place] [address]   Patrica Duel, M.D.  Fax: [phone]

## 2010-05-30 NOTE — Op Note (Signed)
NAME:  [name] Hunter, [name] Hunter NO.:  [id number]   MEDICAL RECORD NO.:  [id number]          PATIENT TYPE:  AMB   LOCATION:  DAY                           FACILITY:  [facility]   PHYSICIAN:  Dalia Heading, M.D.  DATE OF BIRTH:  [birth year]   DATE OF PROCEDURE:  06/17/2005  DATE OF DISCHARGE:                                 OPERATIVE REPORT   PREOPERATIVE DIAGNOSIS:  Right inguinal hernia.   POSTOPERATIVE DIAGNOSIS:  Right inguinal hernia, direct.   PROCEDURE:  Right inguinal herniorrhaphy.   SURGEON:  Dr. Franky Macho.   ANESTHESIA:  Spinal.   INDICATIONS:  The patient is a 65 year old white male who presents with a  right inguinal hernia.  The risks and benefits of the procedure including  bleeding, infection, pain, recurrence of the hernia were fully explained to  the patient, who gave informed consent.   PROCEDURE NOTE:  The patient was placed in supine position.  After spinal  anesthesia was administered, the right groin region was prepped and draped  in the usual sterile technique with Betadine.  Surgical site confirmation  was performed.   A transverse incision was made in the right groin region down to the  external oblique aponeurosis.  The aponeurosis was incised to the external  ring.  The ilioinguinal nerve was identified and retracted superiorly from  the operative field.  Penrose drain was placed around the spermatic cord.  The vas deferens was noted within the spermatic cord.  The patient had a  direct hernia.  No indirect hernia was identified.  The direct hernia was  incised at its base and inverted.  A large-size polypropylene mesh plug was  then placed into this region secured circumferentially to the transversalis  fascia using 2-0 Novofil interrupted sutures.  An onlay polypropylene mesh  patch was then placed along the floor of the inguinal canal and secured  superiorly to the conjoined tendon and inferiorly to the shelving edge of  Poupart's  ligament using 2-0 Novofil interrupted sutures.  The internal ring  was recreated using a 2-0 Novofil interrupted suture.  The external oblique  aponeurosis was reapproximated using a 2-0 Vicryl running suture.  The  subcutaneous layer was reapproximated using a 3-0 Vicryl interrupted suture.  The skin was closed using a 4-0 Vicryl subcuticular suture.  0.5 cm  Sensorcaine was instilled in the surrounding wound.  Dermabond was then  applied.   All tape and needle counts correct at the end of the procedure.  The patient  was transferred to PACU in stable condition.   COMPLICATIONS:  None.   SPECIMEN:  None.   BLOOD LOSS:  Minimal.      Dalia Heading, M.D.  Electronically Signed     MAJ/MEDQ  D:  06/17/2005  T:  06/17/2005  Job:  [id number]   cc:   Patrica Duel, M.D.  Fax: [phone]

## 2010-06-13 ENCOUNTER — Ambulatory Visit (INDEPENDENT_AMBULATORY_CARE_PROVIDER_SITE_OTHER): Payer: BC Managed Care – PPO

## 2010-06-13 VITALS — BP 148/93 | HR 95 | Ht 69.0 in | Wt 206.0 lb

## 2010-06-13 DIAGNOSIS — I10 Essential (primary) hypertension: Secondary | ICD-10-CM

## 2010-06-13 NOTE — Progress Notes (Signed)
S: Pt. arrives in office for a BP check and rhythm strip with nurse B: On last OV on 05-23-10 with Dr. Dietrich Pates  pt. was advised to stop taking Amiodarone in 12 weeks (on 05-30-10) and start Diltiazem 120mg  qd. A: Pt. c/o sob (O2=95%) and occasional left sided CP that radiates to his left shoulder. BP this morning is 148/93 and on last OV BP was 135/78, he states he took his meds this morning at 7:30am.Rhythm strip obtained and placed in Dr. Marvel Plan EKG folder. Pt. brought in his medication bottles and states he takes as directed.  R: Pt. advised that we will contact him with Dr. Marvel Plan recommendations, if any.  06/14/10      Reference to 'stop the amiodarone at 12 weeks' it is unclear.  Patient should not be taking amiodarone-please verify.  Blood pressure control is suboptimal; at chlorthalidone 12.5 mg q.d. With a basic chemistry profile to be obtained in one month.  Return visit with me as planned.      Worthington Bing, M.D.

## 2010-06-16 NOTE — Progress Notes (Signed)
**Note De-identified [name] Hunter Obfuscation** Left message for pt. To call office.

## 2010-06-20 ENCOUNTER — Other Ambulatory Visit: Payer: Self-pay | Admitting: *Deleted

## 2010-06-20 ENCOUNTER — Encounter: Payer: Self-pay | Admitting: *Deleted

## 2010-06-20 ENCOUNTER — Ambulatory Visit (INDEPENDENT_AMBULATORY_CARE_PROVIDER_SITE_OTHER): Payer: BC Managed Care – PPO | Admitting: *Deleted

## 2010-06-20 DIAGNOSIS — I4891 Unspecified atrial fibrillation: Secondary | ICD-10-CM

## 2010-06-20 DIAGNOSIS — I10 Essential (primary) hypertension: Secondary | ICD-10-CM

## 2010-06-20 DIAGNOSIS — I4892 Unspecified atrial flutter: Secondary | ICD-10-CM

## 2010-06-20 MED ORDER — CHLORTHALIDONE 25 MG PO TABS: 12.5000 mg | ORAL_TABLET | Freq: Every day | ORAL | Status: DC

## 2010-06-20 MED ORDER — DILTIAZEM HCL ER COATED BEADS 180 MG PO CP24: 180.0000 mg | ORAL_CAPSULE | Freq: Every day | ORAL | Status: DC

## 2010-06-20 NOTE — Progress Notes (Signed)
Labs and chlorthalidone ordered for pt, verbalized understanding of instructions

## 2010-06-22 NOTE — Progress Notes (Signed)
Cannot sign note for some reason

## 2010-06-24 ENCOUNTER — Encounter: Payer: Self-pay | Admitting: Cardiology

## 2010-06-25 ENCOUNTER — Encounter: Payer: Self-pay | Admitting: Cardiology

## 2010-07-18 ENCOUNTER — Encounter: Payer: BC Managed Care – PPO | Admitting: *Deleted

## 2010-07-24 ENCOUNTER — Encounter: Payer: BC Managed Care – PPO | Admitting: *Deleted

## 2010-07-25 ENCOUNTER — Ambulatory Visit (INDEPENDENT_AMBULATORY_CARE_PROVIDER_SITE_OTHER): Payer: BC Managed Care – PPO | Admitting: Cardiology

## 2010-07-25 ENCOUNTER — Ambulatory Visit (INDEPENDENT_AMBULATORY_CARE_PROVIDER_SITE_OTHER): Payer: BC Managed Care – PPO | Admitting: *Deleted

## 2010-07-25 ENCOUNTER — Encounter: Payer: Self-pay | Admitting: Cardiology

## 2010-07-25 DIAGNOSIS — I10 Essential (primary) hypertension: Secondary | ICD-10-CM

## 2010-07-25 DIAGNOSIS — Z720 Tobacco use: Secondary | ICD-10-CM

## 2010-07-25 DIAGNOSIS — I4891 Unspecified atrial fibrillation: Secondary | ICD-10-CM

## 2010-07-25 DIAGNOSIS — I4892 Unspecified atrial flutter: Secondary | ICD-10-CM

## 2010-07-25 DIAGNOSIS — Z7901 Long term (current) use of anticoagulants: Secondary | ICD-10-CM

## 2010-07-25 DIAGNOSIS — F17200 Nicotine dependence, unspecified, uncomplicated: Secondary | ICD-10-CM

## 2010-07-25 DIAGNOSIS — I480 Paroxysmal atrial fibrillation: Secondary | ICD-10-CM

## 2010-07-25 NOTE — Assessment & Plan Note (Signed)
Patient congratulated on discontinuing tobacco use and on continuing to abstain.

## 2010-07-25 NOTE — Assessment & Plan Note (Addendum)
Atrial fibrillation may be persistent at this point.  There is no clear connection between patients symptoms and arrhythmia.  He has a history of at least some snoring-a sleep study could be considered for evaluation of daytime somnolence.  I will leave that decision to Dr. Sherwood Gambler.  Except for aspirin, current therapy appears appropriate.  I will reassess this nice gentleman in 9 months

## 2010-07-25 NOTE — Assessment & Plan Note (Addendum)
Anticoagulation has been stable and therapeutic.  There appears to be no benefit to concomitant therapy with aspirin, which will be discontinued.  Patient's only risk factor for thromboembolism is hypertension.  Consideration can be given to substituting aspirin for warfarin in the future.  Stool for Hemoccult testing will be obtained.  A recent CBC was normal.

## 2010-07-25 NOTE — Patient Instructions (Signed)
Your physician has recommended you make the following change in your medication: stop taking Aspirin  Your physician recommends that you complete hemoccult cards given to you at today's visit. Please follow instructions included in envelope with hemoccult cards  Your physician discussed the hazards of tobacco use. Tobacco use cessation is recommended and techniques and options to help you quit were discussed.  Your physician recommends that you schedule a follow-up appointment in: 9 months

## 2010-07-25 NOTE — Progress Notes (Signed)
HPI : [name] Hunter returns to the office as scheduled for continued assessment and treatment of paroxysmal atrial fibrillation.  Since his last visit, he has done generally well.  He reports daytime somnolence and fatigue with minimal dyspnea and some orthostatic lightheadedness.  He has had no falls, no loss of consciousness and no chest discomfort.  He discontinued cigarette smoking approximately one year ago.  Current Outpatient Prescriptions on File Prior to Visit  Medication Sig Dispense Refill  . chlorthalidone (HYGROTON) 25 MG tablet Take 0.5 tablets (12.5 mg total) by mouth daily.  15 tablet  3  . levothyroxine (SYNTHROID, LEVOTHROID) 50 MCG tablet Take 1 tablet (50 mcg total) by mouth daily.  30 tablet  3  . lisinopril (PRINIVIL,ZESTRIL) 5 MG tablet Take 5 mg by mouth daily.       . pravastatin (PRAVACHOL) 40 MG tablet Take 40 mg by mouth daily.        Marland Kitchen warfarin (COUMADIN) 2.5 MG tablet Take by mouth as directed.           No Known Allergies    Past medical history, social history, and family history reviewed and updated.  ROS: See history of present illness.  PHYSICAL EXAM: BP 122/79  Pulse 89  Ht 5\' 9"  (1.753 m)  Wt 95.255 kg (210 lb)  BMI 31.01 kg/m2  SpO2 98% ; no significant orthostatic change in blood pressure General-Well developed; no acute distress Body habitus-overweight Neck-No JVD; no carotid bruits Lungs-clear lung fields; resonant to percussion Cardiovascular-normal PMI; normal S1 and S2; irregular rhythm Abdomen-normal bowel sounds; soft and non-tender without masses or organomegaly Musculoskeletal-No deformities, no cyanosis or clubbing Neurologic-Normal cranial nerves; symmetric strength and tone Skin-Warm, no significant lesions Extremities-distal pulses intact; trace edema  EKG: (Rhythm Strip)-atrial fibrillation with controlled ventricular response; heart rate-90 bpm  ASSESSMENT AND PLAN:

## 2010-07-25 NOTE — Assessment & Plan Note (Signed)
Blood pressure control is excellent with current therapy, which will be continued. 

## 2010-08-11 ENCOUNTER — Other Ambulatory Visit: Payer: Self-pay | Admitting: Cardiology

## 2010-08-15 ENCOUNTER — Ambulatory Visit (INDEPENDENT_AMBULATORY_CARE_PROVIDER_SITE_OTHER): Payer: BC Managed Care – PPO | Admitting: *Deleted

## 2010-08-15 DIAGNOSIS — I4892 Unspecified atrial flutter: Secondary | ICD-10-CM

## 2010-08-15 DIAGNOSIS — I4891 Unspecified atrial fibrillation: Secondary | ICD-10-CM

## 2010-08-25 ENCOUNTER — Other Ambulatory Visit: Payer: Self-pay

## 2010-08-25 MED ORDER — LISINOPRIL 5 MG PO TABS: 5.0000 mg | ORAL_TABLET | Freq: Every day | ORAL | Status: DC

## 2010-08-25 NOTE — Telephone Encounter (Signed)
.   Requested Prescriptions   Pending Prescriptions Disp Refills  . lisinopril (PRINIVIL,ZESTRIL) 5 MG tablet 30 tablet 6    Sig: Take 1 tablet (5 mg total) by mouth daily.

## 2010-09-02 ENCOUNTER — Other Ambulatory Visit: Payer: Self-pay

## 2010-09-02 MED ORDER — LISINOPRIL 5 MG PO TABS: 5.0000 mg | ORAL_TABLET | Freq: Every day | ORAL | Status: DC

## 2010-09-02 NOTE — Telephone Encounter (Signed)
.   Requested Prescriptions   Signed Prescriptions Disp Refills  . lisinopril (PRINIVIL,ZESTRIL) 5 MG tablet 30 tablet 6    Sig: Take 1 tablet (5 mg total) by mouth daily.    Authorizing Provider: Kathlen Brunswick.    Ordering User: Lacie Scotts

## 2010-09-12 ENCOUNTER — Ambulatory Visit (INDEPENDENT_AMBULATORY_CARE_PROVIDER_SITE_OTHER): Payer: BC Managed Care – PPO | Admitting: *Deleted

## 2010-09-12 DIAGNOSIS — I4892 Unspecified atrial flutter: Secondary | ICD-10-CM

## 2010-09-12 DIAGNOSIS — I4891 Unspecified atrial fibrillation: Secondary | ICD-10-CM

## 2010-09-12 LAB — POCT INR: INR: 1.8

## 2010-09-22 ENCOUNTER — Other Ambulatory Visit: Payer: Self-pay | Admitting: Cardiology

## 2010-09-30 ENCOUNTER — Other Ambulatory Visit: Payer: Self-pay | Admitting: *Deleted

## 2010-09-30 MED ORDER — PRAVASTATIN SODIUM 40 MG PO TABS: 40.0000 mg | ORAL_TABLET | Freq: Every day | ORAL | Status: DC

## 2010-10-02 DIAGNOSIS — I4891 Unspecified atrial fibrillation: Secondary | ICD-10-CM

## 2010-10-03 ENCOUNTER — Encounter: Payer: BC Managed Care – PPO | Admitting: *Deleted

## 2010-10-07 LAB — DIFFERENTIAL
Basophils Absolute: 0
Basophils Absolute: 0
Basophils Relative: 0
Eosinophils Absolute: 0.1
Eosinophils Relative: 0
Lymphocytes Relative: 6 — ABNORMAL LOW
Lymphs Abs: 0.9
Monocytes Absolute: 0.2
Monocytes Relative: 3
Neutro Abs: 11.1 — ABNORMAL HIGH
Neutro Abs: 14.6 — ABNORMAL HIGH
Neutrophils Relative %: 87 — ABNORMAL HIGH

## 2010-10-07 LAB — BASIC METABOLIC PANEL
BUN: 7
Calcium: 8.5
Chloride: 109
GFR calc Af Amer: >60
GFR calc non Af Amer: >60
Glucose, Bld: 126 — ABNORMAL HIGH
Potassium: 3.5
Potassium: 3.5
Sodium: 143
Sodium: 143

## 2010-10-07 LAB — CBC
HCT: 34.3 — ABNORMAL LOW
Hemoglobin: 12.2 — ABNORMAL LOW
MCHC: 35.1
MCV: 92
Platelets: 252
RBC: 3.73 — ABNORMAL LOW
RBC: 3.88 — ABNORMAL LOW
RDW: 13.7
RDW: 13.7
WBC: 15.6 — ABNORMAL HIGH

## 2010-10-17 ENCOUNTER — Ambulatory Visit (INDEPENDENT_AMBULATORY_CARE_PROVIDER_SITE_OTHER): Payer: Medicare Other | Admitting: Adult Health

## 2010-10-17 ENCOUNTER — Encounter: Payer: Self-pay | Admitting: Adult Health

## 2010-10-17 ENCOUNTER — Ambulatory Visit (INDEPENDENT_AMBULATORY_CARE_PROVIDER_SITE_OTHER): Payer: Medicare Other | Admitting: *Deleted

## 2010-10-17 VITALS — BP 132/80 | HR 77 | Resp 18 | Ht 68.0 in | Wt 210.8 lb

## 2010-10-17 DIAGNOSIS — I480 Paroxysmal atrial fibrillation: Secondary | ICD-10-CM

## 2010-10-17 DIAGNOSIS — I429 Cardiomyopathy, unspecified: Secondary | ICD-10-CM

## 2010-10-17 DIAGNOSIS — I4892 Unspecified atrial flutter: Secondary | ICD-10-CM

## 2010-10-17 DIAGNOSIS — I4891 Unspecified atrial fibrillation: Secondary | ICD-10-CM

## 2010-10-17 LAB — POCT INR: INR: 1.7

## 2010-10-17 MED ORDER — CHLORTHALIDONE 25 MG PO TABS: 25.0000 mg | ORAL_TABLET | Freq: Every day | ORAL | Status: DC

## 2010-10-17 NOTE — Progress Notes (Addendum)
HPI:  [name] Hunter is a 65 y/o patient of Dr. Dietrich Pates with known history of nonischemic CM with most recent EF of 20% per Echo at [facility] 10/02/2010, PAF on coumadin therapy, with history of DCCV in 7/11; hypertension and ongoing tobacco abuse. There was some notation of renal insufficiency.  He was recently admitted to [facility] hospital secondary to dizziness and seen by Dr. Andee Lineman. Medications were adjusted to include stopping cardizem and beginning metoprolol secondary to systolic dysfx. He was taken off of cholorthalidone.  He has continued NYHA Class 1-11 symtpoms of dyspnea.  He has had no chest pain or dizziness.  He has not adhered to low salt diet and unfortunately continues to smoke.  Records from [facility] obtained and reviewed.  Patient was admitted with tachycardia due to a high ventricular response in atrial fibrillation, weakness and dizziness.  A low digoxin level and INR suggested possible medical noncompliance.  Diltiazem was discontinued and metoprolol started.  BNP and TSH were normal.  Creatinine was 1.8 prompting discontinuation of chlorthalidone.  Creatinine was 1.3 9 months ago.  CT Scan of the head showed no significant abnormalities.  Chest x-ray was clear.  No Known Allergies  Current Outpatient Prescriptions  Medication Sig Dispense Refill  . levothyroxine (SYNTHROID, LEVOTHROID) 50 MCG tablet Take 1 tablet (50 mcg total) by mouth daily.  30 tablet  3  . lisinopril (PRINIVIL,ZESTRIL) 5 MG tablet Take 1 tablet (5 mg total) by mouth daily.  30 tablet  6  . metoprolol (LOPRESSOR) 50 MG tablet Take 50 mg by mouth 2 (two) times daily.        . pravastatin (PRAVACHOL) 40 MG tablet Take 1 tablet (40 mg total) by mouth daily.  30 tablet  10  . warfarin (COUMADIN) 2.5 MG tablet USE AS DIRECTED BY COUMADIN CLINIC  45 tablet  2    Past Medical History  Diagnosis Date  . Paroxysmal atrial fibrillation 2007    Initially presented with atrial flutter for which RFA was  performed in 2007; nl LV function in 2006; EF of 20% in 2011; no sig. coronary disease; +CHF;  DC cardioversion-> EF of 55%  . Hypertension   . Tobacco abuse, in remission     50 pack years; quit in 2011  . Psoriasis   . Angioedema     possibly secondary to tomato indigestion  . Colonic polyp   . Gastritis     Antral  . Chronic anticoagulation     Past Surgical History  Procedure Date  . Inguinal hernia repair 06/2005    Right  . Colonoscopy w/ polypectomy 2009    ZOX:WRUEAV of systems complete and found to be negative unless listed above PHYSICAL EXAM BP 132/80  Pulse 77  Resp 18  Ht 5\' 8"  (1.727 m)  Wt 210 lb 12.8 oz (95.618 kg)  BMI 32.05 kg/m2  SpO2 97%  General: Well developed, well nourished, in no acute distress Head: Eyes PERRLA, No xanthomas.   Normal cephalic and atramatic Edentulous Lungs: Clear bilaterally to auscultation and percussion. Heart: HRRR S1 S2,distant heart sounds  Pulses are 2+ & equal. Diminished in LE/            No carotid bruit. No JVD.  No abdominal bruits. No femoral bruits. Abdomen: Bowel sounds are positive, abdomen firm  and non-tender without masses or  Hernia's noted. Msk:  Back normal, normal gait. Normal strength and tone for age. Extremities: No clubbing, cyanosis, positive  for 1+ - 2+  eedema.  Neuro: Alert and oriented X 3. Psych:  Good affect, responds appropriately   ASSESSMENT AND PLAN

## 2010-10-17 NOTE — Patient Instructions (Signed)
**Note De-Identified Vicenta Olds Obfuscation** Your physician has recommended you make the following change in your medication: resume Chlorothalidone 25 mg daily  Your physician recommends that you weigh, daily, at the same time every day, and in the same amount of clothing. Please record your daily weights on the handout provided and bring it to your next appointment.  Your physician recommends that you start a low salt diet  Your physician recommends that you schedule a follow-up appointment in: 2 weeks

## 2010-10-17 NOTE — Assessment & Plan Note (Signed)
Heart rate is well controlled at present with dig and metoprolol. He will continue in the coumadin as directed.

## 2010-10-17 NOTE — Assessment & Plan Note (Signed)
[name] Hunter continues to have some DOE, and lower extremity edema.  He is not adhering to low salt diet. I have restarted chlorthalidone 25 mg daily to assist with decreased preload with systolic dysfx.  Will repeat labs in one week for kidney fx. He is advised to weigh himself daily and record to evaluate his wt loss/gain.  I will see him in 2 weeks. With low EF would like BP to be lower than 132 systolic and closer to 100.    I have asked him about the possibility of AICD. He refuses this at this time. He states he is "ready to go when its my time."

## 2010-10-20 ENCOUNTER — Other Ambulatory Visit: Payer: Self-pay

## 2010-10-20 DIAGNOSIS — I10 Essential (primary) hypertension: Secondary | ICD-10-CM

## 2010-10-24 ENCOUNTER — Telehealth: Payer: Self-pay | Admitting: *Deleted

## 2010-10-24 NOTE — Telephone Encounter (Signed)
Patient is overdue for BMET and has a follow up visit on next Friday.  Message left for him to return call on Monday so that I may discuss arranging lab for him prior to visit.

## 2010-10-29 ENCOUNTER — Encounter: Payer: Self-pay | Admitting: Adult Health

## 2010-10-30 ENCOUNTER — Telehealth: Payer: Self-pay | Admitting: *Deleted

## 2010-10-31 ENCOUNTER — Ambulatory Visit: Payer: Self-pay | Admitting: Adult Health

## 2010-10-31 NOTE — Progress Notes (Signed)
Patient states that he returned cards

## 2010-11-07 ENCOUNTER — Encounter: Payer: Medicare Other | Admitting: *Deleted

## 2010-11-10 ENCOUNTER — Encounter: Payer: Medicare Other | Admitting: *Deleted

## 2010-11-10 ENCOUNTER — Ambulatory Visit: Payer: Self-pay | Admitting: Adult Health

## 2010-11-13 ENCOUNTER — Encounter: Payer: Self-pay | Admitting: Adult Health

## 2010-11-17 ENCOUNTER — Encounter: Payer: Medicare Other | Admitting: *Deleted

## 2010-11-17 ENCOUNTER — Ambulatory Visit: Payer: Self-pay | Admitting: Adult Health

## 2010-11-21 ENCOUNTER — Ambulatory Visit (INDEPENDENT_AMBULATORY_CARE_PROVIDER_SITE_OTHER): Payer: Medicare Other | Admitting: Adult Health

## 2010-11-21 ENCOUNTER — Other Ambulatory Visit: Payer: Self-pay | Admitting: *Deleted

## 2010-11-21 ENCOUNTER — Ambulatory Visit (INDEPENDENT_AMBULATORY_CARE_PROVIDER_SITE_OTHER): Payer: Medicare Other | Admitting: *Deleted

## 2010-11-21 ENCOUNTER — Encounter: Payer: Self-pay | Admitting: Adult Health

## 2010-11-21 DIAGNOSIS — Z7901 Long term (current) use of anticoagulants: Secondary | ICD-10-CM

## 2010-11-21 DIAGNOSIS — I4891 Unspecified atrial fibrillation: Secondary | ICD-10-CM

## 2010-11-21 DIAGNOSIS — I429 Cardiomyopathy, unspecified: Secondary | ICD-10-CM

## 2010-11-21 DIAGNOSIS — I480 Paroxysmal atrial fibrillation: Secondary | ICD-10-CM

## 2010-11-21 DIAGNOSIS — I4892 Unspecified atrial flutter: Secondary | ICD-10-CM

## 2010-11-21 LAB — POCT INR: INR: 1.9

## 2010-11-21 LAB — BASIC METABOLIC PANEL
BUN: 19 mg/dL (ref 6–23)
CO2: 29 mEq/L (ref 19–32)
Chloride: 104 mEq/L (ref 96–112)
Creat: 1.24 mg/dL (ref 0.50–1.35)
Potassium: 4.2 mEq/L (ref 3.5–5.3)

## 2010-11-21 LAB — PROTIME-INR: INR: 1.62 — ABNORMAL HIGH (ref <1.50)

## 2010-11-21 MED ORDER — LISINOPRIL 5 MG PO TABS: 5.0000 mg | ORAL_TABLET | Freq: Every day | ORAL | Status: DC

## 2010-11-21 MED ORDER — PRAVASTATIN SODIUM 40 MG PO TABS: 40.0000 mg | ORAL_TABLET | Freq: Every day | ORAL | Status: DC

## 2010-11-21 MED ORDER — WARFARIN SODIUM 2.5 MG PO TABS: 2.5000 mg | ORAL_TABLET | ORAL | Status: DC

## 2010-11-21 MED ORDER — CHLORTHALIDONE 25 MG PO TABS: 25.0000 mg | ORAL_TABLET | Freq: Every day | ORAL | Status: DC

## 2010-11-21 MED ORDER — METOPROLOL TARTRATE 50 MG PO TABS: 50.0000 mg | ORAL_TABLET | Freq: Two times a day (BID) | ORAL | Status: DC

## 2010-11-21 NOTE — Assessment & Plan Note (Signed)
HR remains regular on current medications.  He will have INR drawn with BMET today.

## 2010-11-21 NOTE — Assessment & Plan Note (Signed)
He is doing extremely well, has lost 8 lbs, been medically compliant and has stopped salt intake dramatically.  He states he is feeling better.  BP is low normal for his EF.  I will order the BMET today and also have INR drawn with it.  I will see him in 3 months unless he is symptomatic. I have congratulated him on his progress and adherence to medical regimen, encouraged him to continue this course.

## 2010-11-21 NOTE — Progress Notes (Signed)
HPI:  [name] Hunter is a 65 y/o patient of Dr. Dietrich Pates with known history of nonischemic CM with most recent EF of 20% per Echo at [facility] 10/02/2010, PAF on coumadin therapy, with history of DCCV in 7/11; hypertension and ongoing tobacco abuse. There was some notation of renal insufficiency.  He was recently admitted to [facility] hospital secondary to dizziness and seen by Dr. Andee Lineman. Medications were adjusted to include stopping cardizem and beginning metoprolol secondary to systolic dysfx. He was taken off of cholorthalidone.  He has continued NYHA Class 1-11 symtpoms of dyspnea.  He has had no chest pain or dizziness.  He has not adhered to low salt diet and unfortunately continues to smoke.  On last visit, I restarted chlorthalidone 25 mg daily, discussed low salt diet and he was to have BMET. He has forgotten to have BMET drawn, but states he is feeling better, breathing better and has adhered to low salt diet.   No Known Allergies  Current Outpatient Prescriptions  Medication Sig Dispense Refill  . chlorthalidone (HYGROTON) 25 MG tablet Take 1 tablet (25 mg total) by mouth daily.  30 tablet  3  . levothyroxine (SYNTHROID, LEVOTHROID) 50 MCG tablet Take 1 tablet (50 mcg total) by mouth daily.  30 tablet  3  . lisinopril (PRINIVIL,ZESTRIL) 5 MG tablet Take 1 tablet (5 mg total) by mouth daily.  30 tablet  6  . metoprolol (LOPRESSOR) 50 MG tablet Take 50 mg by mouth 2 (two) times daily.        . pravastatin (PRAVACHOL) 40 MG tablet Take 1 tablet (40 mg total) by mouth daily.  30 tablet  10  . warfarin (COUMADIN) 2.5 MG tablet USE AS DIRECTED BY COUMADIN CLINIC  45 tablet  2    Past Medical History  Diagnosis Date  . Paroxysmal atrial fibrillation 2007    Initially presented with atrial flutter for which RFA was performed in 2007; nl LV function in 2006; EF of 20% in 2011; no sig. coronary disease; +CHF;  DC cardioversion-> EF of 55%  . Hypertension   . Tobacco abuse, in remission    50 pack years; quit in 2011  . Psoriasis   . Angioedema     possibly secondary to tomato indigestion  . Colonic polyp   . Gastritis     Antral  . Chronic anticoagulation   . Cardiomyopathy, dilated, nonischemic     Past Surgical History  Procedure Date  . Inguinal hernia repair 06/2005    Right  . Colonoscopy w/ polypectomy 2009    YQM:VHQION of systems complete and found to be negative unless listed above PHYSICAL EXAM BP 108/65  Pulse 85  Ht 5\' 9"  (1.753 m)  Wt 202 lb (91.627 kg)  BMI 29.83 kg/m2  General: Well developed, well nourished, in no acute distress Head: Eyes PERRLA, No xanthomas.   Normal cephalic and atramatic Edentulous Lungs: Clear bilaterally to auscultation and percussion. Heart: HRRR S1 S2,distant heart sounds  Pulses are 2+ & equal. Diminished in LE/            No carotid bruit. No JVD.  No abdominal bruits. No femoral bruits. Abdomen: Bowel sounds are positive, abdomen firm  and non-tender without masses or  Hernia's noted. Msk:  Back normal, normal gait. Normal strength and tone for age. Extremities: No clubbing, cyanosis,negative for edema. Neuro: Alert and oriented X 3. Psych:  Good affect, responds appropriately   ASSESSMENT AND PLAN

## 2010-11-21 NOTE — Patient Instructions (Signed)
Your physician recommends that you schedule a follow-up appointment in: 3 months  Your physician recommends that you return for lab work in: [employer] and PT/INR today

## 2010-12-18 ENCOUNTER — Other Ambulatory Visit: Payer: Self-pay

## 2010-12-18 ENCOUNTER — Encounter (HOSPITAL_COMMUNITY): Payer: Self-pay | Admitting: *Deleted

## 2010-12-18 ENCOUNTER — Emergency Department (HOSPITAL_COMMUNITY)
Admission: EM | Admit: 2010-12-18 | Discharge: 2010-12-18 | Disposition: A | Discharge status: Other Acute Inpatient Hospital | Payer: BC Managed Care – PPO | Attending: Emergency Medicine | Admitting: Emergency Medicine

## 2010-12-18 ENCOUNTER — Emergency Department (HOSPITAL_COMMUNITY): Payer: BC Managed Care – PPO

## 2010-12-18 DIAGNOSIS — Z860100 Personal history of colon polyps, unspecified: Secondary | ICD-10-CM | POA: Insufficient documentation

## 2010-12-18 DIAGNOSIS — I4891 Unspecified atrial fibrillation: Secondary | ICD-10-CM | POA: Insufficient documentation

## 2010-12-18 DIAGNOSIS — E876 Hypokalemia: Secondary | ICD-10-CM | POA: Insufficient documentation

## 2010-12-18 DIAGNOSIS — I10 Essential (primary) hypertension: Secondary | ICD-10-CM | POA: Insufficient documentation

## 2010-12-18 DIAGNOSIS — S82143A Displaced bicondylar fracture of unspecified tibia, initial encounter for closed fracture: Secondary | ICD-10-CM

## 2010-12-18 DIAGNOSIS — I459 Conduction disorder, unspecified: Secondary | ICD-10-CM | POA: Insufficient documentation

## 2010-12-18 DIAGNOSIS — K2940 Chronic atrophic gastritis without bleeding: Secondary | ICD-10-CM | POA: Insufficient documentation

## 2010-12-18 DIAGNOSIS — I4510 Unspecified right bundle-branch block: Secondary | ICD-10-CM | POA: Insufficient documentation

## 2010-12-18 DIAGNOSIS — I428 Other cardiomyopathies: Secondary | ICD-10-CM | POA: Insufficient documentation

## 2010-12-18 DIAGNOSIS — Y92410 Unspecified street and highway as the place of occurrence of the external cause: Secondary | ICD-10-CM | POA: Insufficient documentation

## 2010-12-18 DIAGNOSIS — Z7901 Long term (current) use of anticoagulants: Secondary | ICD-10-CM | POA: Insufficient documentation

## 2010-12-18 DIAGNOSIS — D72829 Elevated white blood cell count, unspecified: Secondary | ICD-10-CM | POA: Insufficient documentation

## 2010-12-18 DIAGNOSIS — M25569 Pain in unspecified knee: Secondary | ICD-10-CM | POA: Insufficient documentation

## 2010-12-18 DIAGNOSIS — S82109A Unspecified fracture of upper end of unspecified tibia, initial encounter for closed fracture: Secondary | ICD-10-CM | POA: Insufficient documentation

## 2010-12-18 LAB — BASIC METABOLIC PANEL
CO2: 28 mEq/L (ref 19–32)
Chloride: 101 mEq/L (ref 96–112)
GFR calc Af Amer: 84 mL/min — ABNORMAL LOW (ref >90)
Potassium: 3.1 mEq/L — ABNORMAL LOW (ref 3.5–5.1)

## 2010-12-18 LAB — DIFFERENTIAL
Eosinophils Relative: 0 % (ref 0–5)
Lymphocytes Relative: 8 % — ABNORMAL LOW (ref 12–46)
Lymphs Abs: 1.2 10*3/uL (ref 0.7–4.0)
Monocytes Absolute: 0.8 10*3/uL (ref 0.1–1.0)
Monocytes Relative: 5 % (ref 3–12)
Neutro Abs: 13.5 10*3/uL — ABNORMAL HIGH (ref 1.7–7.7)

## 2010-12-18 LAB — CBC
HCT: 34.5 % — ABNORMAL LOW (ref 39.0–52.0)
Hemoglobin: 11.9 g/dL — ABNORMAL LOW (ref 13.0–17.0)
MCV: 92 fL (ref 78.0–100.0)
WBC: 15.5 10*3/uL — ABNORMAL HIGH (ref 4.0–10.5)

## 2010-12-18 MED ORDER — MORPHINE SULFATE 4 MG/ML IJ SOLN
4.0000 mg | Freq: Once | INTRAMUSCULAR | Status: AC
  Administered 2010-12-18: 4 mg via INTRAMUSCULAR
  Filled 2010-12-18: qty 1

## 2010-12-18 MED ORDER — DILTIAZEM HCL 50 MG/10ML IV SOLN
20.0000 mg | Freq: Once | INTRAVENOUS | Status: AC
  Administered 2010-12-18: 20 mg via INTRAVENOUS
  Filled 2010-12-18: qty 10

## 2010-12-18 MED ORDER — SODIUM CHLORIDE 0.9 % IV BOLUS (SEPSIS): 500.0000 mL | Freq: Once | INTRAVENOUS | Status: DC

## 2010-12-18 MED ORDER — DILTIAZEM HCL 100 MG IV SOLR
5.0000 mg/h | INTRAVENOUS | Status: DC
  Administered 2010-12-18: 5 mg/h via INTRAVENOUS
  Filled 2010-12-18: qty 100

## 2010-12-18 MED ORDER — POTASSIUM CHLORIDE CRYS ER 20 MEQ PO TBCR
40.0000 meq | EXTENDED_RELEASE_TABLET | Freq: Two times a day (BID) | ORAL | Status: DC
  Administered 2010-12-18: 40 meq via ORAL
  Filled 2010-12-18: qty 2

## 2010-12-18 MED ORDER — SODIUM CHLORIDE 0.9 % IV SOLN
Freq: Once | INTRAVENOUS | Status: AC
  Administered 2010-12-18: 500 mL via INTRAVENOUS

## 2010-12-18 MED ORDER — SODIUM CHLORIDE 0.9 % IV SOLN
INTRAVENOUS | Status: DC
  Administered 2010-12-18: 1000 mL via INTRAVENOUS

## 2010-12-18 NOTE — ED Notes (Signed)
[facility] called for transport to [facility].  Will have a truck sent out soon.

## 2010-12-18 NOTE — ED Notes (Signed)
Pt transferred to [facility].

## 2010-12-18 NOTE — ED Notes (Signed)
Report given to [facility]. Advised would be here in 20 to 25 minutes.

## 2010-12-18 NOTE — ED Notes (Addendum)
Dr Weldon Inches in room taking patient of spine board and removing c-collar.
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
## 2010-12-18 NOTE — ED Notes (Signed)
PALS  At [facility] called for Dr. Weldon Inches.

## 2010-12-18 NOTE — ED Notes (Signed)
Lab called earlier to expedite labs if possible for transfer to [facility].
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
## 2010-12-19 ENCOUNTER — Encounter: Payer: Medicare Other | Admitting: *Deleted

## 2010-12-19 ENCOUNTER — Telehealth: Payer: Self-pay | Admitting: *Deleted
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
## 2011-01-09 ENCOUNTER — Other Ambulatory Visit: Payer: Self-pay | Admitting: Cardiology

## 2011-01-12 ENCOUNTER — Ambulatory Visit (INDEPENDENT_AMBULATORY_CARE_PROVIDER_SITE_OTHER): Payer: BC Managed Care – PPO | Admitting: Cardiology

## 2011-01-12 ENCOUNTER — Encounter: Payer: Self-pay | Admitting: Cardiology

## 2011-01-12 ENCOUNTER — Ambulatory Visit (INDEPENDENT_AMBULATORY_CARE_PROVIDER_SITE_OTHER): Payer: Medicare Other | Admitting: *Deleted

## 2011-01-12 DIAGNOSIS — I4891 Unspecified atrial fibrillation: Secondary | ICD-10-CM

## 2011-01-12 DIAGNOSIS — Z7901 Long term (current) use of anticoagulants: Secondary | ICD-10-CM

## 2011-01-12 DIAGNOSIS — I4892 Unspecified atrial flutter: Secondary | ICD-10-CM

## 2011-01-12 DIAGNOSIS — I428 Other cardiomyopathies: Secondary | ICD-10-CM

## 2011-01-12 DIAGNOSIS — I10 Essential (primary) hypertension: Secondary | ICD-10-CM

## 2011-01-12 DIAGNOSIS — T783XXA Angioneurotic edema, initial encounter: Secondary | ICD-10-CM

## 2011-01-12 DIAGNOSIS — I429 Cardiomyopathy, unspecified: Secondary | ICD-10-CM

## 2011-01-12 DIAGNOSIS — I480 Paroxysmal atrial fibrillation: Secondary | ICD-10-CM
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
## 2011-01-12 NOTE — Patient Instructions (Addendum)
Your physician recommends that you schedule a follow-up appointment in: 1 month for a blood pressure check with nurse and in 3 months with provider  Your physician has requested that you regularly monitor and record your blood pressure readings at home. Please use the same machine at the same time of day to check your readings and record them to bring to your follow-up visit.   Stool cards x 3 and return to office asap  Your physician recommends that you increase your salt intake.  Please call this office at [phone] if you gain 4 pounds in a 3 to 5 day period.   Your physician recommends that you return for lab work in: This week

## 2011-01-12 NOTE — Assessment & Plan Note (Signed)
CBC and stool for Hemoccult testing will be obtained to exclude occult GI blood loss. 

## 2011-01-12 NOTE — Assessment & Plan Note (Addendum)
Patient is uncertain whether he was seen by Cardiology while hospitalized-records have been requested.  Cardiac medications were tapered, likely as the result of hypotension.  Relative hypotension in turn is likely the result of prolonged bed rest and markedly reduced physical activity.  Under the circumstances, treatment with an ACE inhibitor cannot be resumed despite the patient's severe cardiomyopathy.  He appears compensated at the present time and does not need a diuretic.  He was cautioned to avoid falling when he is experiencing dizziness and advised increased salt intake.  He will call for any significant weight gain; otherwise, I will reassess him in 3 months.

## 2011-01-12 NOTE — Assessment & Plan Note (Signed)
Elevation of blood pressure certainly not a problem at present.  Basic laboratory studies including a chemistry profile will be obtained.

## 2011-01-14 ENCOUNTER — Other Ambulatory Visit: Payer: Self-pay | Admitting: Cardiology

## 2011-01-14 MED ORDER — LEVOTHYROXINE SODIUM 50 MCG PO TABS: 50.0000 ug | ORAL_TABLET | Freq: Every day | ORAL | Status: DC

## 2011-01-14 MED ORDER — METOPROLOL TARTRATE 50 MG PO TABS: 50.0000 mg | ORAL_TABLET | Freq: Every day | ORAL | Status: DC

## 2011-01-20 ENCOUNTER — Encounter: Payer: Self-pay | Admitting: *Deleted

## 2011-01-20 LAB — CBC
HCT: 34.4 % — ABNORMAL LOW (ref 39.0–52.0)
MCV: 96.6 fL (ref 78.0–100.0)
RBC: 3.56 MIL/uL — ABNORMAL LOW (ref 4.22–5.81)
WBC: 9 10*3/uL (ref 4.0–10.5)

## 2011-01-20 LAB — COMPREHENSIVE METABOLIC PANEL
BUN: 11 mg/dL (ref 6–23)
CO2: 25 mEq/L (ref 19–32)
Calcium: 9.5 mg/dL (ref 8.4–10.5)
Chloride: 104 mEq/L (ref 96–112)
Creat: 0.96 mg/dL (ref 0.50–1.35)

## 2011-01-20 LAB — LIPID PANEL
Cholesterol: 146 mg/dL (ref 0–200)
HDL: 34 mg/dL — ABNORMAL LOW (ref >39)
Total CHOL/HDL Ratio: 4.3 Ratio
Triglycerides: 147 mg/dL (ref <150)

## 2011-01-22 ENCOUNTER — Telehealth: Payer: Self-pay | Admitting: *Deleted

## 2011-01-22 NOTE — Telephone Encounter (Signed)
Vernona Rieger from [facility] called to advise that pts blood pressure has been in the low 100 range and he feels a little dizzy when he gets up.  Asvised her to tell him to take 1/2 dose of Metoprolol today and tomorrow and continue to monitor pressures.  She will be going out tomorrow and will call me with assessment and will go from there.

## 2011-01-23 ENCOUNTER — Encounter (INDEPENDENT_AMBULATORY_CARE_PROVIDER_SITE_OTHER): Payer: BC Managed Care – PPO

## 2011-01-23 ENCOUNTER — Telehealth: Payer: Self-pay | Admitting: Cardiology

## 2011-01-23 DIAGNOSIS — Z7901 Long term (current) use of anticoagulants: Secondary | ICD-10-CM

## 2011-01-23 NOTE — Telephone Encounter (Signed)
PT BP TODAY IS 130/82  RANGE FOR LAST 24 HRS 109/75-130/82. NOTHING ELSE TO REPORT.

## 2011-01-23 NOTE — Telephone Encounter (Signed)
Made her aware of acceptable readings

## 2011-01-28 ENCOUNTER — Encounter: Payer: No Typology Code available for payment source | Admitting: *Deleted

## 2011-01-28 ENCOUNTER — Other Ambulatory Visit: Payer: Self-pay

## 2011-01-28 ENCOUNTER — Telehealth: Payer: Self-pay | Admitting: Cardiology

## 2011-01-28 DIAGNOSIS — Z7901 Long term (current) use of anticoagulants: Secondary | ICD-10-CM

## 2011-01-28 NOTE — Telephone Encounter (Signed)
NO CALL BACK NEEDED: JUST CALLING TO LET us KNOW THEY ARE DISCHARGING PT HIS BP HAS BEEN DOING GOOD AND INSURANCE ONLY COVERED 3 VISIT/TMJ

## 2011-02-13 ENCOUNTER — Ambulatory Visit (INDEPENDENT_AMBULATORY_CARE_PROVIDER_SITE_OTHER): Payer: BC Managed Care – PPO | Admitting: *Deleted

## 2011-02-13 ENCOUNTER — Encounter: Payer: Self-pay | Admitting: *Deleted

## 2011-02-13 VITALS — BP 138/71 | HR 78

## 2011-02-13 DIAGNOSIS — I10 Essential (primary) hypertension: Secondary | ICD-10-CM

## 2011-02-13 NOTE — Progress Notes (Signed)
Presents to office for return BP check.  BP have been adequate per record with systolic readings in the one teen range to 122 as the high.  No complaints at this visit.

## 2011-02-16 ENCOUNTER — Ambulatory Visit (INDEPENDENT_AMBULATORY_CARE_PROVIDER_SITE_OTHER): Payer: BC Managed Care – PPO | Admitting: *Deleted

## 2011-02-16 DIAGNOSIS — I4891 Unspecified atrial fibrillation: Secondary | ICD-10-CM

## 2011-02-16 DIAGNOSIS — I4892 Unspecified atrial flutter: Secondary | ICD-10-CM

## 2011-02-16 DIAGNOSIS — I480 Paroxysmal atrial fibrillation: Secondary | ICD-10-CM

## 2011-02-23 ENCOUNTER — Ambulatory Visit: Payer: Medicare Other | Admitting: Cardiology

## 2011-03-16 ENCOUNTER — Ambulatory Visit (INDEPENDENT_AMBULATORY_CARE_PROVIDER_SITE_OTHER): Payer: BC Managed Care – PPO | Admitting: *Deleted

## 2011-03-16 DIAGNOSIS — I4892 Unspecified atrial flutter: Secondary | ICD-10-CM

## 2011-03-16 DIAGNOSIS — Z7901 Long term (current) use of anticoagulants: Secondary | ICD-10-CM

## 2011-03-16 DIAGNOSIS — I480 Paroxysmal atrial fibrillation: Secondary | ICD-10-CM

## 2011-03-16 DIAGNOSIS — I4891 Unspecified atrial fibrillation: Secondary | ICD-10-CM

## 2011-04-09 ENCOUNTER — Encounter: Payer: Self-pay | Admitting: Cardiology

## 2011-04-09 ENCOUNTER — Ambulatory Visit (INDEPENDENT_AMBULATORY_CARE_PROVIDER_SITE_OTHER): Payer: BC Managed Care – PPO | Admitting: Cardiology

## 2011-04-09 VITALS — BP 140/81 | HR 93 | Ht 69.0 in | Wt 200.0 lb

## 2011-04-09 DIAGNOSIS — I4891 Unspecified atrial fibrillation: Secondary | ICD-10-CM

## 2011-04-09 DIAGNOSIS — Z7901 Long term (current) use of anticoagulants: Secondary | ICD-10-CM

## 2011-04-09 DIAGNOSIS — I10 Essential (primary) hypertension: Secondary | ICD-10-CM

## 2011-04-09 DIAGNOSIS — D126 Benign neoplasm of colon, unspecified: Secondary | ICD-10-CM

## 2011-04-09 MED ORDER — LISINOPRIL 10 MG PO TABS: 10.0000 mg | ORAL_TABLET | Freq: Every day | ORAL | Status: DC

## 2011-04-09 MED ORDER — METOPROLOL TARTRATE 25 MG PO TABS: 25.0000 mg | ORAL_TABLET | Freq: Two times a day (BID) | ORAL | Status: DC

## 2011-04-09 NOTE — Patient Instructions (Addendum)
Your physician recommends that you schedule a follow-up appointment in: 6 MONTHS  Your physician has recommended you make the following change in your medication:  1 - START LISINOPRIL 10 MG DAILY 2 - METOPROLOL 25 MG TWICE DAILY  Your physician recommends that you return for lab work in: TODAY

## 2011-04-09 NOTE — Assessment & Plan Note (Addendum)
With recovery of left ventricular systolic function, the issue as to whether long-term anticoagulation is required can be reconsidered.  Mild hypertension is his only risk factor for thromboembolism at present.  A transesophageal echocardiogram might assist in determining optimal treatment for [name] Hunter.  Warfarin will be continued for the present.

## 2011-04-09 NOTE — Progress Notes (Signed)
Patient ID: [name] Hunter, male   DOB: 09-Feb-1945, 66 y.o.   MRN: [id number]  HPI: Scheduled return visit for this very nice gentleman with atrial fibrillation and hypertension.  Since his last visit, he has done quite well.  He is now 50% weightbearing on the right leg and is ambulating with a walker.  Denies chest discomfort and dyspnea.  According to his orthopaedic surgeon, his injuries are healing well.  He has noted mild edema in the right leg.  Prior to Admission medications   Medication Sig Start Date End Date Taking? Authorizing Provider  levothyroxine (SYNTHROID, LEVOTHROID) 50 MCG tablet Take 1 tablet (50 mcg total) by mouth daily. 01/14/11 01/14/12 Yes Kathlen Brunswick, MD  oxyCODONE-acetaminophen (PERCOCET) 5-325 MG per tablet Take 1 tablet by mouth every 4 (four) hours as needed.     Yes Historical Provider, MD  pravastatin (PRAVACHOL) 40 MG tablet Take 1 tablet (40 mg total) by mouth daily. 11/21/10  Yes Kathlen Brunswick, MD  warfarin (COUMADIN) 2.5 MG tablet Take 2.5 mg by mouth daily. Patient states that he takes one & one-half tablet on Tuesdays and Thursdays, and takes one tablet on all other days  11/21/10  Yes Kathlen Brunswick, MD  lisinopril (PRINIVIL,ZESTRIL) 10 MG tablet Take 1 tablet (10 mg total) by mouth daily. 04/09/11 04/08/12  Kathlen Brunswick, MD  metoprolol tartrate (LOPRESSOR) 25 MG tablet Take 1 tablet (25 mg total) by mouth 2 (two) times daily. 04/09/11 04/08/12  Kathlen Brunswick, MD   No Known Allergies    Past medical history, social history, and family history reviewed and updated.  ROS: Denies orthopnea, PND, weight gain, palpitations, lightheadedness or syncope.  All other systems reviewed and are negative.  PHYSICAL EXAM: BP 140/81  Pulse 93  Ht 5\' 9"  (1.753 m)  Wt 90.719 kg (200 lb)  BMI 29.53 kg/m2  General-Well developed; no acute distress Body habitus-Mildly overweight Neck-No JVD; no carotid bruits Lungs-clear lung fields; resonant to  percussion Cardiovascular-normal PMI; normal S1 and S2; irregular rhythm Abdomen-normal bowel sounds; soft and non-tender without masses or organomegaly Musculoskeletal-No deformities, no cyanosis or clubbing Neurologic-Normal cranial nerves; symmetric strength and tone Skin-Warm, no significant lesions Extremities-distal pulses intact; 1+ right ankle edema; surgical incisions in the right leg are well healed.  Rhythm Strip: Atrial fibrillation with a somewhat rapid ventricular response of 110 bpm.  ASSESSMENT AND PLAN:  [place] Bing, MD 04/09/2011 7:22 PM

## 2011-04-09 NOTE — Assessment & Plan Note (Signed)
Heart rate is somewhat rapid.  With the patient's history of tachycardia-induced cardiomyopathy, this is of some concern.  Metoprolol will be increased to 25 mg b.i.d.

## 2011-04-09 NOTE — Assessment & Plan Note (Signed)
Blood pressure is borderline.  Lisinopril will be added at a dose of 10 mg per day.

## 2011-04-13 ENCOUNTER — Ambulatory Visit (INDEPENDENT_AMBULATORY_CARE_PROVIDER_SITE_OTHER): Payer: BC Managed Care – PPO | Admitting: *Deleted

## 2011-04-13 DIAGNOSIS — I4891 Unspecified atrial fibrillation: Secondary | ICD-10-CM

## 2011-04-13 DIAGNOSIS — Z7901 Long term (current) use of anticoagulants: Secondary | ICD-10-CM

## 2011-04-16 DIAGNOSIS — D509 Iron deficiency anemia, unspecified: Secondary | ICD-10-CM | POA: Insufficient documentation

## 2011-04-16 LAB — CBC
Hemoglobin: 11.1 g/dL — ABNORMAL LOW (ref 13.0–17.0)
MCH: 26.9 pg (ref 26.0–34.0)
MCV: 90.3 fL (ref 78.0–100.0)
RBC: 4.13 MIL/uL — ABNORMAL LOW (ref 4.22–5.81)

## 2011-04-16 LAB — IRON AND TIBC
%SAT: 10 % — ABNORMAL LOW (ref 20–55)
Iron: 35 ug/dL — ABNORMAL LOW (ref 42–165)

## 2011-04-16 LAB — FERRITIN: Ferritin: 14 ng/mL — ABNORMAL LOW (ref 22–322)

## 2011-04-17 ENCOUNTER — Encounter: Payer: Self-pay | Admitting: *Deleted

## 2011-04-20 ENCOUNTER — Ambulatory Visit (HOSPITAL_COMMUNITY)
Admission: RE | Admit: 2011-04-20 | Discharge: 2011-04-20 | Disposition: A | Discharge status: Home / Self Care | Payer: BC Managed Care – PPO | Source: Ambulatory Visit | Attending: Rehabilitation | Admitting: Rehabilitation

## 2011-04-20 DIAGNOSIS — IMO0001 Reserved for inherently not codable concepts without codable children: Secondary | ICD-10-CM | POA: Insufficient documentation

## 2011-04-20 DIAGNOSIS — M6281 Muscle weakness (generalized): Secondary | ICD-10-CM | POA: Insufficient documentation

## 2011-04-20 DIAGNOSIS — M25569 Pain in unspecified knee: Secondary | ICD-10-CM | POA: Insufficient documentation

## 2011-04-20 DIAGNOSIS — M25669 Stiffness of unspecified knee, not elsewhere classified: Secondary | ICD-10-CM | POA: Insufficient documentation

## 2011-04-20 DIAGNOSIS — R269 Unspecified abnormalities of gait and mobility: Secondary | ICD-10-CM | POA: Insufficient documentation

## 2011-04-23 ENCOUNTER — Ambulatory Visit (HOSPITAL_COMMUNITY)
Admission: RE | Admit: 2011-04-23 | Discharge: 2011-04-23 | Disposition: A | Discharge status: Home / Self Care | Payer: BC Managed Care – PPO | Source: Ambulatory Visit | Attending: Physical Therapy | Admitting: Physical Therapy

## 2011-04-23 NOTE — Progress Notes (Signed)
Physical Therapy Treatment Patient Details  Name: [name] Hunter MRN: [id number] Date of Birth: 08-20-1945  Today's Date: 04/23/2011 Time: 0102-7253 Time Calculation (min): 42 min Visit#: 2  of 8   Re-eval: 05/20/11 Charges:  Gait 25', therex 15'    Subjective: Symptoms/Limitations Symptoms: Pt. states he was a little sore after last visit but not having any pain.  Pt. with noted edema and instructed to elevate and ice throughout the day. Pain Assessment Currently in Pain?: No/denies  Exercises Instructed by Trilby Leaver, SPTA under the direction of Aviyanna Colbaugh Bascom Levels, PTA/CI. Exercise/Treatments Aerobic Stationary Bike: 6' @ 2.5 for ROM  Standing Heel Raises: 15 reps;Limitations Heel Raises Limitations: toeraises 15 reps Lateral Step Up: 10 reps;Step Height: 4";Hand Hold: 1 Forward Step Up: 10 reps;Step Height: 4";Hand Hold: 1 Stairs: outdoors, one flight with SPC and HR reciprocally 1 RT Gait Training: outdoors/grass, curb, ramps X 1000 feet with SPC  Physical Therapy Assessment and Plan PT Assessment and Plan Clinical Impression Statement: Pt. ambulated outdoors on uneven terrain/ramps and curbs with SPC and SBA.  Pt. required VC's for proper sequencing for stepping on/off curb.  Pt. able to complete all exercises/activities without pain or difficulty.  Encouraged to ice/elevate to reduce swelling. PT Plan: Continue to progress Strength and function.     Problem List Patient Active Problem List  Diagnoses  . COLONIC POLYPS  . Tachycardia induced cardiomyopathy  . PSORIASIS  . ANGIOEDEMA  . HYPOTHYROIDISM  . Hypertension  . Tobacco abuse, in remission  . Atrial fibrillation  . Chronic anticoagulation  . Anemia, iron deficiency    PT - End of Session Equipment Utilized During Treatment: Gait belt Activity Tolerance: Patient tolerated treatment well General Behavior During Session: [facility] for tasks performed Cognition: [facility] for tasks performed   Trilby Leaver, SPTA/ Graycen Sadlon B. Bascom Levels, PTA 04/23/2011, 5:53 PM
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
## 2011-04-28 ENCOUNTER — Ambulatory Visit (HOSPITAL_COMMUNITY)
Admission: RE | Admit: 2011-04-28 | Discharge: 2011-04-28 | Disposition: A | Discharge status: Home / Self Care | Payer: BC Managed Care – PPO | Source: Ambulatory Visit | Attending: Internal Medicine | Admitting: Internal Medicine

## 2011-04-28 NOTE — Progress Notes (Addendum)
Physical Therapy Treatment Patient Details  Name: [name] Hunter MRN: [id number] Date of Birth: 04-Sep-1945  Today's Date: 04/28/2011 Time: 4098-1191 Time Calculation (min): 44 min Visit#: 3  of 8   Re-eval: 05/20/11 Diagnosis: R tibial plateau fracture. Surgical Date: 12/17/11 Next MD Visit: 07/01/11 Charges:  therex 38'  Subjective: Symptoms/Limitations Symptoms: PT. states he's not having any difficulty and feels his strength is coming along.  Pt. reports he no longer has a ramp to get in/out of home so having to use the stairs all the time.  Pt. reports he uses step to gait on steps. Pain Assessment Currently in Pain?: No/denies  Exercises Instructed by Trilby Leaver, SPTA under the direction of [place] Bascom Levels, PTA/CI. Exercise/Treatments Aerobic Stationary Bike: 6' @ 2.5 for ROM Machines for Strengthening Cybex Leg Press: eccentric gastroc 1PL 2X10 Standing Heel Raises: 15 reps Heel Raises Limitations: toeraises 15 reps Lateral Step Up: 15 reps;Step Height: 4";Hand Hold: 1 Forward Step Up: 15 reps;Step Height: 4";Hand Hold: 1 Functional Squat: 10 reps Stairs: indoor, 1 flight 1 HR and SPC, reciprocally SLS with Vectors: 5X5" Other Standing Knee Exercises: Hip ext, Hip flex 15 reps Seated Other Seated Knee Exercises: PROM for flexion 3X30" Other Seated Knee Exercises: Tband ankle DF/PF   Physical Therapy Assessment and Plan PT Assessment and Plan Clinical Impression Statement: Added new exercises to increase strength of ankle, hip and quad muscles.  Pt. able to complete all without pain or difficulty.  Pt. able to negotiate stairs reciprocally with cueing with descending being more difficult. PT Plan: Continue to progress strength and ROM.     Problem List Patient Active Problem List  Diagnoses  . COLONIC POLYPS  . Tachycardia induced cardiomyopathy  . PSORIASIS  . ANGIOEDEMA  . HYPOTHYROIDISM  . Hypertension  . Tobacco abuse, in remission  . Atrial  fibrillation  . Chronic anticoagulation  . Anemia, iron deficiency    PT - End of Session Activity Tolerance: Patient tolerated treatment well General Behavior During Session: [facility] for tasks performed Cognition: [facility] for tasks performed   Trilby Leaver, SPTA/ Viral Schramm B. Bascom Levels, PTA 04/28/2011, 6:44 PM

## 2011-04-30 ENCOUNTER — Ambulatory Visit (HOSPITAL_COMMUNITY)
Admission: RE | Admit: 2011-04-30 | Discharge: 2011-04-30 | Disposition: A | Discharge status: Home / Self Care | Payer: BC Managed Care – PPO | Source: Ambulatory Visit | Attending: Rehabilitation | Admitting: Rehabilitation

## 2011-04-30 NOTE — Progress Notes (Signed)
Physical Therapy Treatment Patient Details  Name: RANVIR RENOVATO MRN: [id number] Date of Birth: [birth year]  Today's Date: 04/30/2011 Time: 1415-1450 Time Calculation (min): 35 min Visit#: 4  of 8   Re-eval: 05/20/11 Charges:  therex 14',  Gait 15'    Subjective: Symptoms/Limitations Symptoms: Pt. reports he has been doing his HEP.  Pt. questioning when he can ambulate outdoors without his cane.   Pain Assessment Currently in Pain?: No/denies  Exercises Instructed by Trilby Leaver, SPTA under the direction of Sinead Hockman Bascom Levels, PTA/CI. Exercise/Treatments Aerobic Stationary Bike: 6' @ 2.5 for ROM Standing Heel Raises: 15 reps Heel Raises Limitations: toeraises 15 reps Lateral Step Up: 15 reps;Step Height: 4";Hand Hold: 1 Forward Step Up: 15 reps;Step Height: 4";Hand Hold: 1 Functional Squat: 15 reps SLS with Vectors: 5X5" 1 HHA Gait Training: outdoors/grass, curb, ramps X 500 feet without AD Seated Long Arc Quad: 15 reps;Weights Long Arc Quad Weight: 5 lbs. Prone  Hamstring Curl: 15 reps;Limitations Hamstring Curl Limitations: 5# Hip Extension: 10 reps Other Prone Exercises: TKE 10X5" holds     Physical Therapy Assessment and Plan PT Assessment and Plan Clinical Impression Statement: Completed outdoor ambulation without AD with pt. able to safely demonstrate curb negotiation, walking through grass, ramps and uneven terrain without LOB or difficulty.   Added new strengthening exercises without difficultyl other than hip extension weakness in prone.  Discussed personal goals with pt., however he could not name any specific area he would like to work on.  Pt would like to return to fishing. PT Plan: Progress to cybex quad/ham for strengthening, tandem/retro gait for balance and practice reciprocal stairs in hospital.      PT - End of Session Equipment Utilized During Treatment: Gait belt Activity Tolerance: Patient tolerated treatment well General Behavior During  Session: [facility] for tasks performed   Trilby Leaver, SPTA/ Karyl Sharrar B. Bascom Levels, PTA 04/30/2011, 3:06 PM

## 2011-05-05 ENCOUNTER — Ambulatory Visit (HOSPITAL_COMMUNITY)
Admission: RE | Admit: 2011-05-05 | Discharge: 2011-05-05 | Disposition: A | Discharge status: Home / Self Care | Payer: BC Managed Care – PPO | Source: Ambulatory Visit | Attending: Internal Medicine | Admitting: Internal Medicine

## 2011-05-05 NOTE — Progress Notes (Signed)
Physical Therapy Treatment Patient Details  Name: [name] Hunter MRN: [id number] Date of Birth: [birth year]  Today's Date: 05/05/2011 Time: 1478-2956 Time Calculation (min): 41 min Visit#: 5  of 8   Re-eval: 05/20/11 Charges: Therex x 33'  Subjective: Symptoms/Limitations Symptoms: Pt states that he is pain free. Pain Assessment Currently in Pain?: No/denies   Exercise/Treatments Aerobic Stationary Bike: 6' @ 2.5 for ROM Machines for Strengthening Cybex Knee Extension: 2PL x 15 Cybex Knee Flexion: 4Pl x 15 Standing Heel Raises: 20 reps Heel Raises Limitations: toeraises 20 reps Lateral Step Up: 15 reps;Step Height: 4";Hand Hold: 1 Forward Step Up: 15 reps;Step Height: 4";Hand Hold: 1 Step Down: Step Height: 2";Right (4" too difficult) Functional Squat: 15 reps Rocker Board: 2 minutes SLS with Vectors: 5X5" 1 HHA Prone  Hamstring Curl: 15 reps;Limitations Hamstring Curl Limitations: 5# Hip Extension: 15 reps Other Prone Exercises: TKE X5" holds  Physical Therapy Assessment and Plan PT Assessment and Plan Clinical Impression Statement: Pt completes all therex  well with minimal need for cueing. Pt is without complaint throughout session. Began stairs, retro/tandem gait, and Cybex machines with minimal difficulty. PT reports 0/10 pain at end of session. PT Plan: Continue to progress per PT POC.     Problem List Patient Active Problem List  Diagnoses  . COLONIC POLYPS  . Tachycardia induced cardiomyopathy  . PSORIASIS  . ANGIOEDEMA  . HYPOTHYROIDISM  . Hypertension  . Tobacco abuse, in remission  . Atrial fibrillation  . Chronic anticoagulation  . Anemia, iron deficiency    PT - End of Session Activity Tolerance: Patient tolerated treatment well General Behavior During Session: [facility] for tasks performed Cognition: [facility] for tasks performed  Seth Bake, PTA 05/05/2011, 3:24 PM

## 2011-05-07 ENCOUNTER — Ambulatory Visit (HOSPITAL_COMMUNITY): Payer: BC Managed Care – PPO | Admitting: *Deleted

## 2011-05-07 ENCOUNTER — Telehealth (HOSPITAL_COMMUNITY): Payer: Self-pay

## 2011-05-12 ENCOUNTER — Ambulatory Visit (HOSPITAL_COMMUNITY)
Admission: RE | Admit: 2011-05-12 | Discharge: 2011-05-12 | Disposition: A | Discharge status: Home / Self Care | Payer: BC Managed Care – PPO | Source: Ambulatory Visit | Attending: Rehabilitation | Admitting: Rehabilitation

## 2011-05-12 NOTE — Progress Notes (Signed)
Physical Therapy Treatment Patient Details  Name: [name] Hunter MRN: [id number] Date of Birth: Oct 26, 1945  Today's Date: 05/12/2011 Time: 1478-2956 PT Time Calculation (min): 41 min Visit#: 6  of 8   Re-eval: 05/20/11 Charges: Therex x 33'   Subjective: Symptoms/Limitations Symptoms: Pt is pain free and without complaint.  Pain Assessment Currently in Pain?: No/denies   Exercise/Treatments Stretches Quad Stretch: 3 reps;30 seconds Aerobic Stationary Bike: 6' @ 3.0 for ROM Machines for Strengthening Cybex Knee Extension: 2PL x 20 Cybex Knee Flexion: 4Pl x 20 RLE only Standing Lateral Step Up: 15 reps;Step Height: 4";Hand Hold: 1 Forward Step Up: 15 reps;Step Height: 4";Hand Hold: 1 Step Down: 15 reps;Right;Step Height: 4";Hand Hold: 1 Functional Squat: 15 reps Rocker Board: 2 minutes SLS with Vectors: 5X5" 1 HHA Supine Bridges: 10 reps Prone  Hamstring Curl: 15 reps;Limitations Hamstring Curl Limitations: 5# Hip Extension: 15 reps Other Prone Exercises: TKE 15 X5" holds  Physical Therapy Assessment and Plan PT Assessment and Plan Clinical Impression Statement: Pt able to tolerate step downs with 4" forward step downs. Pt is without complaint throughout session. Began prone quad stretch with rope to improve knee flexion. Pt reports 0/10 pain at end of session. PT Plan: Continue to progress per PT POC.     Problem List Patient Active Problem List  Diagnoses  . COLONIC POLYPS  . Tachycardia induced cardiomyopathy  . PSORIASIS  . ANGIOEDEMA  . HYPOTHYROIDISM  . Hypertension  . Tobacco abuse, in remission  . Atrial fibrillation  . Chronic anticoagulation  . Anemia, iron deficiency    PT - End of Session Activity Tolerance: Patient tolerated treatment well General Behavior During Session: [facility] for tasks performed Cognition: [facility] for tasks performed   Seth Bake, PTA 05/12/2011, 3:22 PM

## 2011-05-14 ENCOUNTER — Other Ambulatory Visit: Payer: Self-pay | Admitting: Cardiology

## 2011-05-14 ENCOUNTER — Ambulatory Visit (HOSPITAL_COMMUNITY)
Admission: RE | Admit: 2011-05-14 | Discharge: 2011-05-14 | Disposition: A | Discharge status: Home / Self Care | Payer: BC Managed Care – PPO | Source: Ambulatory Visit | Attending: Internal Medicine | Admitting: Internal Medicine

## 2011-05-14 DIAGNOSIS — M25569 Pain in unspecified knee: Secondary | ICD-10-CM | POA: Insufficient documentation

## 2011-05-14 DIAGNOSIS — R269 Unspecified abnormalities of gait and mobility: Secondary | ICD-10-CM | POA: Insufficient documentation

## 2011-05-14 DIAGNOSIS — IMO0001 Reserved for inherently not codable concepts without codable children: Secondary | ICD-10-CM | POA: Insufficient documentation

## 2011-05-14 DIAGNOSIS — M25669 Stiffness of unspecified knee, not elsewhere classified: Secondary | ICD-10-CM | POA: Insufficient documentation

## 2011-05-14 DIAGNOSIS — M6281 Muscle weakness (generalized): Secondary | ICD-10-CM | POA: Insufficient documentation

## 2011-05-14 NOTE — Progress Notes (Signed)
Physical Therapy Treatment Patient Details  Name: [name] Hunter MRN: [id number] Date of Birth: 1945-04-19  Today's Date: 05/14/2011 Time: 1440-1520 PT Time Calculation (min): 40 min Visit#: 7  of 8   Re-eval: 05/20/11 Charges:  therex 38'     Subjective: Symptoms/Limitations Symptoms: Pt. reports no pain today.  Pt. reports his goal is to be able to get on his knees and kneel. Pain Assessment Currently in Pain?: No/denies   Exercise/Treatments Aerobic Stationary Bike: 8'@3 .0 for ROM Machines for Strengthening Cybex Knee Extension: 2.5PL x 20 Cybex Knee Flexion: 4Pl x 20 RLE only Standing Lateral Step Up: 10 reps;Step Height: 6";Hand Hold: 1 Forward Step Up: 10 reps;Step Height: 6";Hand Hold: 0 Step Down: 10 reps;Step Height: 6";Hand Hold: 1 Wall Squat: 10 reps;5 seconds Rocker Board: 2 minutes SLS with Vectors: 5X5" 1 HHA   Physical Therapy Assessment and Plan PT Assessment and Plan Clinical Impression Statement: Increased to 6" step ups/downs.  Pt. had to come up with nonaffected LE with step downs.  Added lunges to prepare for kneeling activites and progress squats with wallslide holds with noted mm fatigue.  Pt. reports compliance with HEP; PT Plan: Re-evaluate next visit; attempt kneeling.    Problem List Patient Active Problem List  Diagnoses  . COLONIC POLYPS  . Tachycardia induced cardiomyopathy  . PSORIASIS  . ANGIOEDEMA  . HYPOTHYROIDISM  . Hypertension  . Tobacco abuse, in remission  . Atrial fibrillation  . Chronic anticoagulation  . Anemia, iron deficiency    PT - End of Session Activity Tolerance: Patient tolerated treatment well General Behavior During Session: [facility] for tasks performed Cognition: [facility] for tasks performed   Toran Murch B. Bascom Levels, PTA 05/14/2011, 3:29 PM

## 2011-05-23 ENCOUNTER — Other Ambulatory Visit: Payer: Self-pay | Admitting: Cardiology

## 2011-05-25 ENCOUNTER — Ambulatory Visit (INDEPENDENT_AMBULATORY_CARE_PROVIDER_SITE_OTHER): Payer: BC Managed Care – PPO | Admitting: *Deleted

## 2011-05-25 ENCOUNTER — Ambulatory Visit (HOSPITAL_COMMUNITY)
Admission: RE | Admit: 2011-05-25 | Discharge: 2011-05-25 | Disposition: A | Discharge status: Home / Self Care | Payer: BC Managed Care – PPO | Source: Ambulatory Visit | Attending: Internal Medicine | Admitting: Internal Medicine

## 2011-05-25 DIAGNOSIS — Z7901 Long term (current) use of anticoagulants: Secondary | ICD-10-CM

## 2011-05-25 DIAGNOSIS — I4891 Unspecified atrial fibrillation: Secondary | ICD-10-CM

## 2011-05-25 LAB — POCT INR: INR: 3.5

## 2011-05-25 NOTE — Evaluation (Signed)
Physical Therapy  Re-evaluation  Patient Details  Name: [name] CRAIGHEAD Sr. MRN: [id number] Date of Birth: [birth year]  Today's Date: 05/25/2011 Time:7:58  -8:27    Visit#:  8 of  8  Re-eval:   Assessment Diagnosis: R tibial plateau fracture. Surgical Date: 12/17/11 Next MD Visit: 07/01/11 Prior Therapy: [facility]    Past Medical History:  Past Medical History  Diagnosis Date  . Paroxysmal atrial fibrillation 2007    Initially presented with atrial flutter for which RFA was performed in 2007; nl LV function in 2006; EF of 20% in 2011; no sig. coronary disease; +CHF;  DC cardioversion-> EF of 55%  . Hypertension   . Tobacco abuse, in remission     50 pack years; quit in 2011  . Psoriasis   . Angioedema     possibly secondary to tomato indigestion  . Colonic polyp   . Gastritis     Antral  . Chronic anticoagulation   . Cardiomyopathy, dilated, nonischemic    Past Surgical History:  Past Surgical History  Procedure Date  . Inguinal hernia repair 06/2005    Right  . Colonoscopy w/ polypectomy 2009    Subjective Symptoms/Limitations Symptoms: Mr. Gortney is completing his exercises at home.   How long can you sit comfortably?: The patient no longer has limitations in sitting. How long can you stand comfortably?: The patient states he has no limitations standing. 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, kneeling, carrying tools   Leisure: Hobbies-yes (Comment)  Assessment RLE AROM (degrees) Right Knee Extension 0-130: 0  (ws -8) Right Knee Flexion 0-140: 115  (130 was 115) RLE  Strength Right Hip Flexion: 5/5 Right Hip Extension:  (4/5 was 3/5) Right Hip ABduction:  (5/5 was 4/5) Right Hip ADduction: 5/5 Right Knee Flexion: 5/5 (was 3+/5) Right Knee Extension: 5/5 (was 3+/5) Right Ankle Dorsiflexion:  (4+ was 4/5)  Exercise/Treatments Mobility/Balance  Ambulation/Gait Ambulation/Gait: Yes Ambulation/Gait Assistance: 6: Modified independent (Device/Increase time) Assistive device: Straight cane Gait Pattern: Within Functional Limits Stairs: No    Standing Stairs:  (x 1 flight.) Seated Other Seated Knee Exercises: Dorsiflexion w/t-band x 15 Other Seated Knee Exercises: sit to stand x 10 Prone  Hip Extension: 15 reps   Physical Therapy Assessment and Plan    Goals Met recommend D/C Goals Home Exercise Program PT Goal: Perform Home Exercise Program - Progress: Met PT Short Term Goals PT Short Term Goal 1 - Progress: Met PT Short Term Goal 2 - Progress: Met PT Long Term Goals PT Long Term Goal 1 - Progress: Met PT Long Term Goal 2 - Progress: Met Long Term Goal 3 Progress: Met Long Term Goal 4: Pt has not fished yet due to time restraints. Long Term Goal 5 Progress: Met  Problem List Patient Active Problem List  Diagnoses  . COLONIC POLYPS  . Tachycardia induced cardiomyopathy  . PSORIASIS  . ANGIOEDEMA  . HYPOTHYROIDISM  . Hypertension  . Tobacco abuse, in remission  . Atrial fibrillation  . Chronic anticoagulation  . Anemia, iron deficiency      Dalaney Needle,CINDY 05/25/2011, 8:29 AM  Physician Documentation Your signature is required to  indicate approval of the treatment plan as stated above.  Please sign and either send electronically or make a copy of this report for your files and return this physician signed original.   Please mark one 1.__approve of plan  2. ___approve of plan with the following conditions.   ______________________________                                                          _____________________ Physician  Signature                                                                                                             Date

## 2011-06-11 ENCOUNTER — Other Ambulatory Visit: Payer: Self-pay | Admitting: Cardiology

## 2011-06-22 ENCOUNTER — Ambulatory Visit (INDEPENDENT_AMBULATORY_CARE_PROVIDER_SITE_OTHER): Payer: BC Managed Care – PPO | Admitting: *Deleted

## 2011-06-22 DIAGNOSIS — I4891 Unspecified atrial fibrillation: Secondary | ICD-10-CM

## 2011-06-22 DIAGNOSIS — Z7901 Long term (current) use of anticoagulants: Secondary | ICD-10-CM

## 2011-06-22 LAB — POCT INR: INR: 2.1

## 2011-07-20 ENCOUNTER — Ambulatory Visit (INDEPENDENT_AMBULATORY_CARE_PROVIDER_SITE_OTHER): Payer: BC Managed Care – PPO | Admitting: *Deleted

## 2011-07-20 DIAGNOSIS — I4891 Unspecified atrial fibrillation: Secondary | ICD-10-CM

## 2011-07-20 DIAGNOSIS — Z7901 Long term (current) use of anticoagulants: Secondary | ICD-10-CM

## 2011-08-17 ENCOUNTER — Ambulatory Visit (INDEPENDENT_AMBULATORY_CARE_PROVIDER_SITE_OTHER): Payer: BC Managed Care – PPO | Admitting: *Deleted

## 2011-08-17 DIAGNOSIS — I4891 Unspecified atrial fibrillation: Secondary | ICD-10-CM

## 2011-08-17 DIAGNOSIS — Z7901 Long term (current) use of anticoagulants: Secondary | ICD-10-CM

## 2011-09-21 ENCOUNTER — Ambulatory Visit (INDEPENDENT_AMBULATORY_CARE_PROVIDER_SITE_OTHER): Payer: BC Managed Care – PPO | Admitting: *Deleted

## 2011-09-21 DIAGNOSIS — I4891 Unspecified atrial fibrillation: Secondary | ICD-10-CM

## 2011-09-21 DIAGNOSIS — Z7901 Long term (current) use of anticoagulants: Secondary | ICD-10-CM

## 2011-10-12 ENCOUNTER — Other Ambulatory Visit: Payer: Self-pay | Admitting: Cardiology

## 2011-11-02 ENCOUNTER — Ambulatory Visit (INDEPENDENT_AMBULATORY_CARE_PROVIDER_SITE_OTHER): Payer: BC Managed Care – PPO | Admitting: *Deleted

## 2011-11-02 DIAGNOSIS — Z7901 Long term (current) use of anticoagulants: Secondary | ICD-10-CM

## 2011-11-02 DIAGNOSIS — I4891 Unspecified atrial fibrillation: Secondary | ICD-10-CM

## 2011-11-02 LAB — POCT INR: INR: 2.9

## 2011-11-28 ENCOUNTER — Other Ambulatory Visit: Payer: Self-pay | Admitting: Cardiology

## 2011-11-30 NOTE — Telephone Encounter (Signed)
rx sent to pharmacy by e-script Per pt is currently attending coumadin clinic and has upcoming apt with [facility] scheduled 12-13

## 2011-12-01 ENCOUNTER — Other Ambulatory Visit: Payer: Self-pay | Admitting: Cardiology

## 2011-12-01 MED ORDER — WARFARIN SODIUM 2.5 MG PO TABS: 2.5000 mg | ORAL_TABLET | Freq: Every day | ORAL | Status: DC

## 2011-12-14 ENCOUNTER — Ambulatory Visit (INDEPENDENT_AMBULATORY_CARE_PROVIDER_SITE_OTHER): Payer: BC Managed Care – PPO | Admitting: *Deleted

## 2011-12-14 DIAGNOSIS — I4891 Unspecified atrial fibrillation: Secondary | ICD-10-CM

## 2011-12-14 DIAGNOSIS — Z7901 Long term (current) use of anticoagulants: Secondary | ICD-10-CM

## 2011-12-15 ENCOUNTER — Ambulatory Visit (INDEPENDENT_AMBULATORY_CARE_PROVIDER_SITE_OTHER): Payer: BC Managed Care – PPO | Admitting: Cardiology

## 2011-12-15 ENCOUNTER — Encounter: Payer: Self-pay | Admitting: Cardiology

## 2011-12-15 VITALS — BP 142/96 | HR 98 | Ht 69.0 in | Wt 220.4 lb

## 2011-12-15 DIAGNOSIS — Z7901 Long term (current) use of anticoagulants: Secondary | ICD-10-CM

## 2011-12-15 DIAGNOSIS — L408 Other psoriasis: Secondary | ICD-10-CM

## 2011-12-15 DIAGNOSIS — D126 Benign neoplasm of colon, unspecified: Secondary | ICD-10-CM

## 2011-12-15 DIAGNOSIS — I4891 Unspecified atrial fibrillation: Secondary | ICD-10-CM

## 2011-12-15 DIAGNOSIS — R5381 Other malaise: Secondary | ICD-10-CM

## 2011-12-15 DIAGNOSIS — E039 Hypothyroidism, unspecified: Secondary | ICD-10-CM

## 2011-12-15 DIAGNOSIS — D509 Iron deficiency anemia, unspecified: Secondary | ICD-10-CM

## 2011-12-15 DIAGNOSIS — R5383 Other fatigue: Secondary | ICD-10-CM

## 2011-12-15 DIAGNOSIS — I10 Essential (primary) hypertension: Secondary | ICD-10-CM

## 2011-12-15 NOTE — Patient Instructions (Signed)
Your physician recommends that you schedule a follow-up appointment in: 10 months  Your physician recommends that you return for lab work in: Today  A chest x-ray takes a picture of the organs and structures inside the chest, including the heart, lungs, and blood vessels. This test can show several things, including, whether the heart is enlarges; whether fluid is building up in the lungs; and whether pacemaker / defibrillator leads are still in place.  Stools x 3 and return to office asap

## 2011-12-15 NOTE — Progress Notes (Deleted)
Name: [name] JOSWICK Sr.    DOB: 05/25/1945  Age: 66 y.o.  MR#: [id number]       PCP:  Cassell Smiles., MD      Insurance: @PAYORNAME @   CC:   No chief complaint on file.  MEDICATION BOTTLES NO RECENT LABS  VS BP 142/96  Pulse 98  Ht 5\' 9"  (1.753 m)  Wt 220 lb 6.4 oz (99.973 kg)  BMI 32.55 kg/m2  Weights Current Weight  12/15/11 220 lb 6.4 oz (99.973 kg)  04/09/11 200 lb (90.719 kg)  01/12/11 185 lb (83.915 kg)    Blood Pressure  BP Readings from Last 3 Encounters:  12/15/11 142/96  04/09/11 140/81  02/13/11 138/71     Admit date:  (Not on file) Last encounter with RMR:  11/28/2011   Allergy No Known Allergies  Current Outpatient Prescriptions  Medication Sig Dispense Refill  . lisinopril (PRINIVIL,ZESTRIL) 10 MG tablet Take 1 tablet (10 mg total) by mouth daily.  30 tablet  12  . metoprolol tartrate (LOPRESSOR) 25 MG tablet Take 1 tablet (25 mg total) by mouth 2 (two) times daily.  60 tablet  12  . oxyCODONE-acetaminophen (PERCOCET) 5-325 MG per tablet Take 1 tablet by mouth every 4 (four) hours as needed.        . pravastatin (PRAVACHOL) 40 MG tablet TAKE ONE TABLET BY MOUTH EVERY DAY  30 tablet  9  . warfarin (COUMADIN) 2.5 MG tablet Take 2.5 mg by mouth daily. Patient states that he takes one & one-half tablet on Tuesdays and Thursdays, and takes one tablet on all other days       . [DISCONTINUED] warfarin (COUMADIN) 2.5 MG tablet Take 1 tablet (2.5 mg total) by mouth daily.  45 tablet  3  . [DISCONTINUED] pravastatin (PRAVACHOL) 40 MG tablet Take 1 tablet (40 mg total) by mouth daily.  30 tablet  6    Discontinued Meds:    Medications Discontinued During This Encounter  Medication Reason  . pravastatin (PRAVACHOL) 40 MG tablet Error  . levothyroxine (SYNTHROID, LEVOTHROID) 50 MCG tablet Error  . warfarin (COUMADIN) 2.5 MG tablet Error    Patient Active Problem List  Diagnosis  . COLONIC POLYPS  . Tachycardia induced cardiomyopathy  . PSORIASIS  .  ANGIOEDEMA  . HYPOTHYROIDISM  . Hypertension  . Tobacco abuse, in remission  . Atrial fibrillation  . Chronic anticoagulation  . Anemia, iron deficiency    LABS Anti-coag visit on 11/02/2011  Component Date Value  . INR 12/14/2011 2.7   Anti-coag visit on 09/21/2011  Component Date Value  . INR 11/02/2011 2.9      Results for this Opt Visit:     Results for orders placed in visit on 11/02/11  POCT INR      Component Value Range   INR 2.7      EKG Orders placed in visit on 12/15/11  . EKG 12-LEAD     Prior Assessment and Plan Problem List as of 12/15/2011            Cardiology Problems   Tachycardia induced cardiomyopathy   Last Assessment & Plan Note   01/12/2011 Office Visit Addendum 01/18/2011 12:29 PM by Kathlen Brunswick, MD    Patient is uncertain whether he was seen by Cardiology while hospitalized-records have been requested.  Cardiac medications were tapered, likely as the result of hypotension.  Relative hypotension in turn is likely the result of prolonged bed rest and markedly reduced physical activity.  Under the circumstances, treatment with an ACE inhibitor cannot be resumed despite the patient's severe cardiomyopathy.  He appears compensated at the present time and does not need a diuretic.  He was cautioned to avoid falling when he is experiencing dizziness and advised increased salt intake.  He will call for any significant weight gain; otherwise, I will reassess him in 3 months.    Hypertension   Last Assessment & Plan Note   04/09/2011 Office Visit Signed 04/09/2011  7:28 PM by Kathlen Brunswick, MD    Blood pressure is borderline.  Lisinopril will be added at a dose of 10 mg per day.    Atrial fibrillation   Last Assessment & Plan Note   04/09/2011 Office Visit Signed 04/09/2011  7:27 PM by Kathlen Brunswick, MD    Heart rate is somewhat rapid.  With the patient's history of tachycardia-induced cardiomyopathy, this is of some concern.  Metoprolol will be  increased to 25 mg b.i.d.      Other   COLONIC POLYPS   PSORIASIS   ANGIOEDEMA   HYPOTHYROIDISM   Tobacco abuse, in remission   Last Assessment & Plan Note   07/25/2010 Office Visit Signed 07/25/2010 12:18 PM by Kathlen Brunswick, MD    Patient congratulated on discontinuing tobacco use and on continuing to abstain.    Chronic anticoagulation   Last Assessment & Plan Note   04/09/2011 Office Visit Addendum 04/10/2011  4:05 PM by Kathlen Brunswick, MD    With recovery of left ventricular systolic function, the issue as to whether long-term anticoagulation is required can be reconsidered.  Mild hypertension is his only risk factor for thromboembolism at present.  A transesophageal echocardiogram might assist in determining optimal treatment for Mr. Hasz.  Warfarin will be continued for the present.    Anemia, iron deficiency       Imaging: No results found.   FRS Calculation: Score not calculated. Missing: Total Cholesterol

## 2011-12-16 LAB — COMPREHENSIVE METABOLIC PANEL
ALT: 13 U/L (ref 0–53)
AST: 15 U/L (ref 0–37)
Albumin: 4.3 g/dL (ref 3.5–5.2)
Alkaline Phosphatase: 74 U/L (ref 39–117)
Potassium: 4.2 mEq/L (ref 3.5–5.3)
Sodium: 145 mEq/L (ref 135–145)
Total Bilirubin: 0.3 mg/dL (ref 0.3–1.2)
Total Protein: 7 g/dL (ref 6.0–8.3)

## 2011-12-16 LAB — FERRITIN: Ferritin: 18 ng/mL — ABNORMAL LOW (ref 22–322)

## 2011-12-16 LAB — IRON AND TIBC: UIBC: 348 ug/dL (ref 125–400)

## 2011-12-16 LAB — CBC
MCH: 30.9 pg (ref 26.0–34.0)
MCHC: 33.9 g/dL (ref 30.0–36.0)
Platelets: 251 10*3/uL (ref 150–400)
RDW: 14.2 % (ref 11.5–15.5)

## 2011-12-17 ENCOUNTER — Encounter: Payer: Self-pay | Admitting: Cardiology
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
## 2011-12-19 NOTE — Assessment & Plan Note (Signed)
Recent lab shows minimal residual anemia, normal indices, but continued evidence for iron deficiency.  Results have been forwarded to Dr. Sherwood Gambler for further evaluation and treatment.

## 2011-12-19 NOTE — Assessment & Plan Note (Signed)
Blood pressure control has been excellent for at least the past 12 months. Current medication will be continued.

## 2011-12-19 NOTE — Assessment & Plan Note (Signed)
Euthyroid in 03/2010, TSH-3.2, free T4-1.1, free T3-2.8.

## 2011-12-19 NOTE — Assessment & Plan Note (Signed)
Doing well with anticoagulation with possible iron deficiency but no evidence for ongoing occult blood loss. Stool Hemoccults will be reassessed.

## 2011-12-19 NOTE — Assessment & Plan Note (Signed)
Atrial fibrillation is likely chronic at this point, but patient is doing well with a strategy of control of heart rate and anticoagulation.

## 2011-12-21 ENCOUNTER — Encounter: Payer: Self-pay | Admitting: *Deleted

## 2012-01-25 ENCOUNTER — Ambulatory Visit (INDEPENDENT_AMBULATORY_CARE_PROVIDER_SITE_OTHER): Payer: Medicare HMO | Admitting: *Deleted

## 2012-01-25 DIAGNOSIS — Z7901 Long term (current) use of anticoagulants: Secondary | ICD-10-CM

## 2012-01-25 DIAGNOSIS — I4891 Unspecified atrial fibrillation: Secondary | ICD-10-CM

## 2012-01-25 LAB — POCT INR: INR: 2.1

## 2012-03-01 ENCOUNTER — Other Ambulatory Visit (INDEPENDENT_AMBULATORY_CARE_PROVIDER_SITE_OTHER): Payer: Self-pay | Admitting: Internal Medicine

## 2012-03-01 MED ORDER — OXYCODONE-ACETAMINOPHEN 5-325 MG PO TABS: 1.0000 | ORAL_TABLET | Freq: Four times a day (QID) | ORAL | Status: DC | PRN

## 2012-03-07 ENCOUNTER — Ambulatory Visit (INDEPENDENT_AMBULATORY_CARE_PROVIDER_SITE_OTHER): Payer: Medicare HMO | Admitting: *Deleted

## 2012-03-07 DIAGNOSIS — Z7901 Long term (current) use of anticoagulants: Secondary | ICD-10-CM

## 2012-03-07 DIAGNOSIS — I4891 Unspecified atrial fibrillation: Secondary | ICD-10-CM

## 2012-03-07 LAB — POCT INR: INR: 2.8

## 2012-04-11 ENCOUNTER — Other Ambulatory Visit: Payer: Self-pay | Admitting: Cardiology

## 2012-04-18 ENCOUNTER — Other Ambulatory Visit: Payer: Self-pay | Admitting: Cardiology

## 2012-04-21 ENCOUNTER — Ambulatory Visit (INDEPENDENT_AMBULATORY_CARE_PROVIDER_SITE_OTHER): Payer: Medicare HMO | Admitting: *Deleted

## 2012-04-21 DIAGNOSIS — I4891 Unspecified atrial fibrillation: Secondary | ICD-10-CM

## 2012-04-21 LAB — POCT INR: INR: 5

## 2012-04-28 ENCOUNTER — Ambulatory Visit (INDEPENDENT_AMBULATORY_CARE_PROVIDER_SITE_OTHER): Payer: Medicare HMO | Admitting: *Deleted

## 2012-04-28 DIAGNOSIS — Z7901 Long term (current) use of anticoagulants: Secondary | ICD-10-CM

## 2012-04-28 DIAGNOSIS — I4891 Unspecified atrial fibrillation: Secondary | ICD-10-CM

## 2012-05-18 ENCOUNTER — Other Ambulatory Visit: Payer: Self-pay | Admitting: Cardiology

## 2012-05-19 ENCOUNTER — Ambulatory Visit (INDEPENDENT_AMBULATORY_CARE_PROVIDER_SITE_OTHER): Payer: Medicare HMO | Admitting: *Deleted

## 2012-05-19 DIAGNOSIS — I4891 Unspecified atrial fibrillation: Secondary | ICD-10-CM

## 2012-05-19 DIAGNOSIS — Z7901 Long term (current) use of anticoagulants: Secondary | ICD-10-CM

## 2012-06-16 ENCOUNTER — Ambulatory Visit (INDEPENDENT_AMBULATORY_CARE_PROVIDER_SITE_OTHER): Payer: Medicare HMO | Admitting: *Deleted

## 2012-06-16 DIAGNOSIS — Z7901 Long term (current) use of anticoagulants: Secondary | ICD-10-CM

## 2012-06-16 DIAGNOSIS — I4891 Unspecified atrial fibrillation: Secondary | ICD-10-CM

## 2012-06-16 LAB — POCT INR: INR: 2.5
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
## 2012-08-03 ENCOUNTER — Ambulatory Visit (INDEPENDENT_AMBULATORY_CARE_PROVIDER_SITE_OTHER): Payer: Medicare HMO | Admitting: *Deleted

## 2012-08-03 DIAGNOSIS — Z7901 Long term (current) use of anticoagulants: Secondary | ICD-10-CM

## 2012-08-03 DIAGNOSIS — I4891 Unspecified atrial fibrillation: Secondary | ICD-10-CM

## 2012-08-18 ENCOUNTER — Other Ambulatory Visit: Payer: Self-pay | Admitting: Cardiology

## 2012-08-31 ENCOUNTER — Ambulatory Visit (INDEPENDENT_AMBULATORY_CARE_PROVIDER_SITE_OTHER): Payer: Medicare HMO | Admitting: *Deleted

## 2012-08-31 DIAGNOSIS — Z7901 Long term (current) use of anticoagulants: Secondary | ICD-10-CM

## 2012-08-31 DIAGNOSIS — I4891 Unspecified atrial fibrillation: Secondary | ICD-10-CM

## 2012-09-16 ENCOUNTER — Other Ambulatory Visit: Payer: Self-pay | Admitting: *Deleted

## 2012-09-16 ENCOUNTER — Telehealth: Payer: Self-pay | Admitting: *Deleted

## 2012-09-16 MED ORDER — METOPROLOL TARTRATE 25 MG PO TABS: 25.0000 mg | ORAL_TABLET | Freq: Two times a day (BID) | ORAL | Status: DC

## 2012-09-16 MED ORDER — WARFARIN SODIUM 2.5 MG PO TABS: 2.5000 mg | ORAL_TABLET | Freq: Every day | ORAL | Status: DC

## 2012-09-16 NOTE — Telephone Encounter (Signed)
Incoming fax of patient medication refill request noted per patient pharmacy. FOR COUMADIN AND METOPROLOL, PLACED COMMENTS FOR PT TO CALL OFFICE TO SCHEDULE APT, RECALL FOR 10-11-12

## 2012-09-19 ENCOUNTER — Other Ambulatory Visit: Payer: Self-pay | Admitting: *Deleted

## 2012-09-19 MED ORDER — WARFARIN SODIUM 2.5 MG PO TABS: ORAL_TABLET | ORAL | Status: DC

## 2012-09-28 ENCOUNTER — Ambulatory Visit (INDEPENDENT_AMBULATORY_CARE_PROVIDER_SITE_OTHER): Payer: Medicare HMO | Admitting: *Deleted

## 2012-09-28 DIAGNOSIS — I4891 Unspecified atrial fibrillation: Secondary | ICD-10-CM

## 2012-09-28 DIAGNOSIS — Z7901 Long term (current) use of anticoagulants: Secondary | ICD-10-CM

## 2012-10-14 ENCOUNTER — Other Ambulatory Visit: Payer: Self-pay | Admitting: *Deleted

## 2012-10-14 MED ORDER — METOPROLOL TARTRATE 25 MG PO TABS: 25.0000 mg | ORAL_TABLET | Freq: Two times a day (BID) | ORAL | Status: DC

## 2012-10-26 ENCOUNTER — Encounter: Payer: Self-pay | Admitting: Cardiovascular Disease

## 2012-10-26 ENCOUNTER — Ambulatory Visit (INDEPENDENT_AMBULATORY_CARE_PROVIDER_SITE_OTHER): Payer: Medicare HMO | Admitting: *Deleted

## 2012-10-26 ENCOUNTER — Ambulatory Visit (INDEPENDENT_AMBULATORY_CARE_PROVIDER_SITE_OTHER): Payer: Medicare HMO | Admitting: Cardiovascular Disease

## 2012-10-26 VITALS — BP 156/94 | HR 94 | Ht 69.0 in | Wt 220.0 lb

## 2012-10-26 DIAGNOSIS — Z7901 Long term (current) use of anticoagulants: Secondary | ICD-10-CM

## 2012-10-26 DIAGNOSIS — I429 Cardiomyopathy, unspecified: Secondary | ICD-10-CM

## 2012-10-26 DIAGNOSIS — I10 Essential (primary) hypertension: Secondary | ICD-10-CM

## 2012-10-26 DIAGNOSIS — I4891 Unspecified atrial fibrillation: Secondary | ICD-10-CM

## 2012-10-26 LAB — POCT INR: INR: 2.2

## 2012-10-26 MED ORDER — LISINOPRIL 20 MG PO TABS: 20.0000 mg | ORAL_TABLET | Freq: Every day | ORAL | Status: DC

## 2012-10-26 MED ORDER — METOPROLOL TARTRATE 50 MG PO TABS: 50.0000 mg | ORAL_TABLET | Freq: Two times a day (BID) | ORAL | Status: DC

## 2012-10-26 NOTE — Patient Instructions (Addendum)
Your physician recommends that you schedule a follow-up appointment in: 6 months with Dr. Dion Body weeks with nurse visit to check BP  Your physician has recommended you make the following change in your medication:   1)INCREASE METOPROLOL 50MG  TWICE DAILY 2) INCREASE LISINOPRIL 20MG  ONCE DAILY 3) YOU CAN TAKE 2 TABLETS OF YOUR CURRENT MEDICATIONS UNTIL COMPLETED THEN PICK UP THE NEW PRESCRIPTION HOWEVER MAKE SURE YOU REVERT BACK TO THE DOSAGE ADVISED ON THE NEW BOTTLES

## 2012-10-26 NOTE — Progress Notes (Signed)
Patient ID: [name] MINAHAN Sr., male   DOB: 1946-01-10, 67 y.o.   MRN: [id number]      SUBJECTIVE: [name] Hunter is a gentleman with a history of atrial fibrillation, HTN, and tachycardia induced cardiomyopathy. An echocardiogram performed on 10-02-2010 revealed severely reduced LV systolic function, EF 20-25%, with global hypokinesis seen, diastolic dysfunction, moderate LAE, and mildly reduced RV function.  Overall, he feels fairly well, denying chest pain, dizziness, palpitations, leg swelling, and syncope. He may get short of breath with exertion up to twice a week.  He is tolerating his medications without difficulty.   No Known Allergies  Current Outpatient Prescriptions  Medication Sig Dispense Refill  . lisinopril (PRINIVIL,ZESTRIL) 10 MG tablet TAKE ONE TABLET BY MOUTH EVERY DAY  30 tablet  8  . metoprolol tartrate (LOPRESSOR) 25 MG tablet Take 1 tablet (25 mg total) by mouth 2 (two) times daily.  60 tablet  3  . pravastatin (PRAVACHOL) 40 MG tablet TAKE ONE TABLET BY MOUTH EVERY DAY  30 tablet  6  . warfarin (COUMADIN) 2.5 MG tablet 1 tablet daily except 1/2 tablet on Wednesdays  45 tablet  0   No current facility-administered medications for this visit.    Past Medical History  Diagnosis Date  . Paroxysmal atrial fibrillation 2007    Initially presented with atrial flutter for which RFA was performed in 2007; nl LV function in 2006; EF of 20% in 2011; no sig. coronary disease; +CHF;  DC cardioversion-> EF of 55%  . Hypertension   . Tobacco abuse, in remission     50 pack years; quit in 2011  . Psoriasis   . Angioedema     possibly secondary to tomato indigestion  . Colonic polyp   . Gastritis     Antral  . Chronic anticoagulation   . Cardiomyopathy, dilated, nonischemic     Past Surgical History  Procedure Laterality Date  . Inguinal hernia repair  06/2005    Right  . Colonoscopy w/ polypectomy  2009    History   Social History  . Marital Status: Married    Spouse Name: N/A    Number of Children: 2  . Years of Education: N/A   Occupational History  . full time    Social History Main Topics  . Smoking status: Former Smoker -- 1.00 packs/day for 50 years    Types: Cigarettes    Quit date: 07/22/2009  . Smokeless tobacco: Never Used  . Alcohol Use: No  . Drug Use: No  . Sexual Activity: Not on file   Other Topics Concern  . Not on file   Social History Narrative  . No narrative on file     BP: 156/94 Pulse: 94  PHYSICAL EXAM General: NAD Neck: No JVD, no thyromegaly or thyroid nodule.  Lungs: Clear to auscultation bilaterally with normal respiratory effort. CV: Nondisplaced PMI.  Heart irregularly irregular and slightly tachycardic, normal S1/S2, no S3/S4, no murmur.  No peripheral edema.  No carotid bruit.  Normal pedal pulses.  Abdomen: Soft, nontender, no hepatosplenomegaly, no distention.  Neurologic: Alert and oriented x 3.  Psych: Normal affect. Extremities: No clubbing or cyanosis.   ECG: reviewed and available in electronic records. (A Fib with HR 105 bpm, incomplete RBBB, nonspecific ST abnormality)      ASSESSMENT AND PLAN: 1. Atrial fibrillation: rate is slightly elevated, and will thus increase metoprolol to 50 mg bid and have him return in 3 weeks for a nurse visit to reassess  HR. Continue warfarin with f/u in INR clinic. 2. Non-ischemic cardiomyopathy: no symptoms of heart failure, with aim to keep HR and BP well controlled. Consider echocardiogram in future to reassess LV systolic function to assess if he is a candidate for additional therapies. 3. HTN: uncontrolled today, so will thus increase lisinopril to 20 mg daily.   Prentice Docker, M.D., F.A.C.C.

## 2012-10-26 NOTE — Addendum Note (Signed)
Addended by: Derry Lory A on: 10/26/2012 09:27 AM   Modules accepted: Orders

## 2012-11-14 ENCOUNTER — Other Ambulatory Visit: Payer: Self-pay | Admitting: *Deleted

## 2012-11-14 MED ORDER — WARFARIN SODIUM 2.5 MG PO TABS: ORAL_TABLET | ORAL | Status: DC

## 2012-11-17 ENCOUNTER — Ambulatory Visit (INDEPENDENT_AMBULATORY_CARE_PROVIDER_SITE_OTHER): Payer: Medicare HMO | Admitting: *Deleted

## 2012-11-17 ENCOUNTER — Encounter: Payer: Self-pay | Admitting: *Deleted

## 2012-11-17 VITALS — BP 112/70 | HR 80 | Ht 69.0 in | Wt 217.0 lb

## 2012-11-17 DIAGNOSIS — I10 Essential (primary) hypertension: Secondary | ICD-10-CM

## 2012-11-17 NOTE — Progress Notes (Signed)
Patient presents to office for nurse visit due to recent medication change. Patient denies sob, dizziness or chest pain. Patient has taken all doses of medications without side effects.

## 2012-11-18 NOTE — Progress Notes (Signed)
Patient informed during nurse visit that he would be contacted if changes made. Per Dr. Kirtland Bouchard. , no changes made to regimen.

## 2012-11-23 ENCOUNTER — Ambulatory Visit (INDEPENDENT_AMBULATORY_CARE_PROVIDER_SITE_OTHER): Payer: Medicare HMO | Admitting: *Deleted

## 2012-11-23 DIAGNOSIS — Z7901 Long term (current) use of anticoagulants: Secondary | ICD-10-CM

## 2012-11-23 DIAGNOSIS — I4891 Unspecified atrial fibrillation: Secondary | ICD-10-CM

## 2012-12-29 ENCOUNTER — Ambulatory Visit (INDEPENDENT_AMBULATORY_CARE_PROVIDER_SITE_OTHER): Payer: Medicare HMO | Admitting: *Deleted

## 2012-12-29 ENCOUNTER — Other Ambulatory Visit: Payer: Self-pay

## 2012-12-29 ENCOUNTER — Telehealth: Payer: Self-pay

## 2012-12-29 DIAGNOSIS — Z7901 Long term (current) use of anticoagulants: Secondary | ICD-10-CM

## 2012-12-29 DIAGNOSIS — I4891 Unspecified atrial fibrillation: Secondary | ICD-10-CM

## 2012-12-29 LAB — POCT INR: INR: 2.3

## 2012-12-29 MED ORDER — WARFARIN SODIUM 2.5 MG PO TABS: ORAL_TABLET | ORAL | Status: DC

## 2012-12-29 NOTE — Telephone Encounter (Signed)
Received fax refill request  Rx # [id number] Medication:  Warfarin 2.5mg  Qty 45 Sig:  Take one tablet by mouth once daily except one-half on Wed Physician:  Purvis Sheffield

## 2013-02-01 ENCOUNTER — Telehealth: Payer: Self-pay | Admitting: Cardiovascular Disease

## 2013-02-01 MED ORDER — LISINOPRIL 20 MG PO TABS: 20.0000 mg | ORAL_TABLET | Freq: Every day | ORAL | Status: DC

## 2013-02-01 NOTE — Telephone Encounter (Signed)
Medication sent via escribe.  

## 2013-02-01 NOTE — Telephone Encounter (Signed)
Received fax refill request  Rx # [id number] Medication:  Lisinopril 20 mg tab Qty 90 Sig:  Take one tablet by mouth once daily Physician:  Bronson Ing

## 2013-02-09 ENCOUNTER — Encounter (INDEPENDENT_AMBULATORY_CARE_PROVIDER_SITE_OTHER): Payer: Self-pay

## 2013-02-09 ENCOUNTER — Ambulatory Visit (INDEPENDENT_AMBULATORY_CARE_PROVIDER_SITE_OTHER): Payer: Medicare HMO | Admitting: *Deleted

## 2013-02-09 DIAGNOSIS — Z7901 Long term (current) use of anticoagulants: Secondary | ICD-10-CM

## 2013-02-09 DIAGNOSIS — I4891 Unspecified atrial fibrillation: Secondary | ICD-10-CM

## 2013-02-09 DIAGNOSIS — Z5181 Encounter for therapeutic drug level monitoring: Secondary | ICD-10-CM

## 2013-02-09 LAB — POCT INR: INR: 2.4

## 2013-03-17 ENCOUNTER — Telehealth: Payer: Self-pay | Admitting: Cardiovascular Disease

## 2013-03-17 MED ORDER — PRAVASTATIN SODIUM 40 MG PO TABS: 40.0000 mg | ORAL_TABLET | Freq: Every day | ORAL | Status: DC

## 2013-03-17 NOTE — Telephone Encounter (Signed)
Received fax refill request  Rx # [id number] Medication:  Pravastatin 40 mg tab Qty 30 Sig:  Take one tablet by mouth once daily Physician:  Angelena Form

## 2013-03-23 ENCOUNTER — Ambulatory Visit (INDEPENDENT_AMBULATORY_CARE_PROVIDER_SITE_OTHER): Payer: Medicare HMO | Admitting: *Deleted

## 2013-03-23 DIAGNOSIS — Z7901 Long term (current) use of anticoagulants: Secondary | ICD-10-CM

## 2013-03-23 DIAGNOSIS — I4891 Unspecified atrial fibrillation: Secondary | ICD-10-CM

## 2013-03-23 DIAGNOSIS — Z5181 Encounter for therapeutic drug level monitoring: Secondary | ICD-10-CM

## 2013-03-23 LAB — POCT INR: INR: 2

## 2013-05-04 ENCOUNTER — Telehealth: Payer: Self-pay | Admitting: Cardiovascular Disease

## 2013-05-04 ENCOUNTER — Ambulatory Visit (INDEPENDENT_AMBULATORY_CARE_PROVIDER_SITE_OTHER): Payer: Medicare HMO | Admitting: *Deleted

## 2013-05-04 DIAGNOSIS — I4891 Unspecified atrial fibrillation: Secondary | ICD-10-CM

## 2013-05-04 DIAGNOSIS — Z7901 Long term (current) use of anticoagulants: Secondary | ICD-10-CM

## 2013-05-04 DIAGNOSIS — Z5181 Encounter for therapeutic drug level monitoring: Secondary | ICD-10-CM

## 2013-05-04 LAB — POCT INR: INR: 2.4

## 2013-05-04 MED ORDER — METOPROLOL TARTRATE 50 MG PO TABS
50.0000 mg | ORAL_TABLET | Freq: Two times a day (BID) | ORAL | Status: DC
Start: 2013-05-04 — End: 2013-10-30

## 2013-05-04 NOTE — Telephone Encounter (Signed)
Received fax refill request  Rx # [id number] Medication:  Metoprolol Tart 50 mg tab Qty 180 Sig:  Take one tablet by mouth twice daily Physician:  Bronson Ing

## 2013-05-04 NOTE — Telephone Encounter (Signed)
Refill metoprolol complete

## 2013-06-15 ENCOUNTER — Telehealth: Payer: Self-pay | Admitting: Cardiovascular Disease

## 2013-06-15 MED ORDER — LISINOPRIL 20 MG PO TABS: 20.0000 mg | ORAL_TABLET | Freq: Every day | ORAL | Status: DC

## 2013-06-15 NOTE — Telephone Encounter (Signed)
Medication sent via escribe.  

## 2013-06-15 NOTE — Telephone Encounter (Signed)
Received fax refill request  Rx # [id number] Medication:  Lisinopril 20 mg tab Qty 90 Sig:  Take one tablet by mouth once daily Physician:  Bronson Ing

## 2013-06-22 ENCOUNTER — Ambulatory Visit (INDEPENDENT_AMBULATORY_CARE_PROVIDER_SITE_OTHER): Payer: Medicare HMO | Admitting: *Deleted

## 2013-06-22 DIAGNOSIS — Z7901 Long term (current) use of anticoagulants: Secondary | ICD-10-CM

## 2013-06-22 DIAGNOSIS — I4891 Unspecified atrial fibrillation: Secondary | ICD-10-CM

## 2013-06-22 DIAGNOSIS — Z5181 Encounter for therapeutic drug level monitoring: Secondary | ICD-10-CM

## 2013-06-22 LAB — POCT INR: INR: 1.9

## 2013-07-11 ENCOUNTER — Telehealth: Payer: Self-pay | Admitting: Cardiovascular Disease

## 2013-07-11 MED ORDER — WARFARIN SODIUM 2.5 MG PO TABS: ORAL_TABLET | ORAL | Status: AC

## 2013-07-11 NOTE — Telephone Encounter (Signed)
Rx sent to [facility]. 

## 2013-07-11 NOTE — Telephone Encounter (Signed)
Received fax refill request  Rx # [id number] Medication:  Warfarin 2.5 mg tab Qty 45 Sig: Take one tablet by mouth once daily except one-half tablet on Wednesdays Physician:  Bronson Ing

## 2013-07-20 ENCOUNTER — Ambulatory Visit (INDEPENDENT_AMBULATORY_CARE_PROVIDER_SITE_OTHER): Payer: Medicare HMO | Admitting: *Deleted

## 2013-07-20 DIAGNOSIS — Z5181 Encounter for therapeutic drug level monitoring: Secondary | ICD-10-CM

## 2013-07-20 DIAGNOSIS — I4891 Unspecified atrial fibrillation: Secondary | ICD-10-CM

## 2013-07-20 DIAGNOSIS — Z7901 Long term (current) use of anticoagulants: Secondary | ICD-10-CM

## 2013-07-20 LAB — POCT INR: INR: 2.8

## 2013-08-17 ENCOUNTER — Ambulatory Visit (INDEPENDENT_AMBULATORY_CARE_PROVIDER_SITE_OTHER): Payer: Medicare HMO | Admitting: *Deleted

## 2013-08-17 DIAGNOSIS — Z7901 Long term (current) use of anticoagulants: Secondary | ICD-10-CM

## 2013-08-17 DIAGNOSIS — Z5181 Encounter for therapeutic drug level monitoring: Secondary | ICD-10-CM

## 2013-08-17 DIAGNOSIS — I4891 Unspecified atrial fibrillation: Secondary | ICD-10-CM

## 2013-08-17 LAB — POCT INR: INR: 2.8

## 2013-09-13 ENCOUNTER — Encounter: Payer: Self-pay | Admitting: Cardiovascular Disease

## 2013-09-14 ENCOUNTER — Ambulatory Visit (INDEPENDENT_AMBULATORY_CARE_PROVIDER_SITE_OTHER): Payer: Medicare HMO | Admitting: Pharmacist

## 2013-09-14 DIAGNOSIS — I4891 Unspecified atrial fibrillation: Secondary | ICD-10-CM

## 2013-09-14 DIAGNOSIS — Z7901 Long term (current) use of anticoagulants: Secondary | ICD-10-CM

## 2013-09-14 DIAGNOSIS — Z5181 Encounter for therapeutic drug level monitoring: Secondary | ICD-10-CM

## 2013-09-14 LAB — POCT INR: INR: 2.6

## 2013-09-19 ENCOUNTER — Telehealth: Payer: Self-pay | Admitting: *Deleted

## 2013-09-19 MED ORDER — LISINOPRIL 20 MG PO TABS: 20.0000 mg | ORAL_TABLET | Freq: Every day | ORAL | Status: AC

## 2013-09-19 NOTE — Telephone Encounter (Signed)
[facility] faxed request for lisinopril 20 mg #90

## 2013-09-19 NOTE — Telephone Encounter (Signed)
Refill complete 

## 2013-10-23 ENCOUNTER — Telehealth: Payer: Self-pay | Admitting: *Deleted

## 2013-10-23 MED ORDER — PRAVASTATIN SODIUM 40 MG PO TABS: 40.0000 mg | ORAL_TABLET | Freq: Every day | ORAL | Status: AC

## 2013-10-23 NOTE — Telephone Encounter (Signed)
Pravastatin needs called in to [facility] in [place]/[facility]

## 2013-10-24 ENCOUNTER — Ambulatory Visit: Payer: Medicare HMO | Admitting: Cardiovascular Disease

## 2013-10-30 ENCOUNTER — Encounter: Payer: Self-pay | Admitting: Cardiovascular Disease

## 2013-10-30 ENCOUNTER — Ambulatory Visit (INDEPENDENT_AMBULATORY_CARE_PROVIDER_SITE_OTHER): Payer: Medicare HMO | Admitting: *Deleted

## 2013-10-30 ENCOUNTER — Ambulatory Visit (INDEPENDENT_AMBULATORY_CARE_PROVIDER_SITE_OTHER): Payer: Medicare HMO | Admitting: Cardiovascular Disease

## 2013-10-30 VITALS — BP 102/68 | HR 86 | Ht 69.0 in | Wt 203.0 lb

## 2013-10-30 DIAGNOSIS — I5022 Chronic systolic (congestive) heart failure: Secondary | ICD-10-CM

## 2013-10-30 DIAGNOSIS — I519 Heart disease, unspecified: Secondary | ICD-10-CM

## 2013-10-30 DIAGNOSIS — I482 Chronic atrial fibrillation, unspecified: Secondary | ICD-10-CM

## 2013-10-30 DIAGNOSIS — I4891 Unspecified atrial fibrillation: Secondary | ICD-10-CM

## 2013-10-30 DIAGNOSIS — Z7901 Long term (current) use of anticoagulants: Secondary | ICD-10-CM

## 2013-10-30 DIAGNOSIS — Z5181 Encounter for therapeutic drug level monitoring: Secondary | ICD-10-CM

## 2013-10-30 DIAGNOSIS — I10 Essential (primary) hypertension: Secondary | ICD-10-CM

## 2013-10-30 LAB — POCT INR: INR: 3.8

## 2013-10-30 MED ORDER — METOPROLOL TARTRATE 50 MG PO TABS: 75.0000 mg | ORAL_TABLET | Freq: Two times a day (BID) | ORAL | Status: AC

## 2013-10-30 NOTE — Patient Instructions (Addendum)
  Your physician wants you to follow-up in: 6 months You will receive a reminder letter in the mail two months in advance. If you don't receive a letter, please call our office to schedule the follow-up appointment.      Your physician has recommended you make the following change in your medication:     INCREASE Metoprolol to 75 mg twice a day     Your physician has requested that you have an echocardiogram in [place]. Echocardiography is a painless test that uses sound waves to create images of your heart. It provides your doctor with information about the size and shape of your heart and how well your heart's chambers and valves are working. This procedure takes approximately one hour. There are no restrictions for this procedure.       Thank you for choosing [facility] !

## 2013-10-30 NOTE — Progress Notes (Signed)
Patient ID: [name] ELK Sr., male   DOB: [birth year], 68 y.o.   MRN: [id number]      SUBJECTIVE: Mr. Gintz presents for routine follow up, and has a history of atrial fibrillation, HTN, and tachycardia induced cardiomyopathy. An echocardiogram performed on 10-02-2010 revealed severely reduced LV systolic function, EF 52-77%, with global hypokinesis seen, diastolic dysfunction, moderate LAE, and mildly reduced RV function. He has been doing well and denies chest pain, shortness of breath, orthopnea, leg swelling, syncope, and paroxysmal nocturnal dyspnea. He tries to avoid salt intake. ECG performed in the office today demonstrates atrial fibrillation with an underlying right bundle branch block, heart rate 99 beats per minute.   Review of Systems: As per "subjective", otherwise negative.  No Known Allergies  Current Outpatient Prescriptions  Medication Sig Dispense Refill  . lisinopril (PRINIVIL,ZESTRIL) 20 MG tablet Take 1 tablet (20 mg total) by mouth daily.  90 tablet  3  . metoprolol (LOPRESSOR) 50 MG tablet Take 1 tablet (50 mg total) by mouth 2 (two) times daily.  180 tablet  3  . pravastatin (PRAVACHOL) 40 MG tablet Take 1 tablet (40 mg total) by mouth daily.  30 tablet  6  . warfarin (COUMADIN) 2.5 MG tablet 1 tablet daily or as directed  45 tablet  6   No current facility-administered medications for this visit.    Past Medical History  Diagnosis Date  . Paroxysmal atrial fibrillation 2007    Initially presented with atrial flutter for which RFA was performed in 2007; nl LV function in 2006; EF of 20% in 2011; no sig. coronary disease; +CHF;  DC cardioversion-> EF of 55%  . Hypertension   . Tobacco abuse, in remission     50 pack years; quit in 2011  . Psoriasis   . Angioedema     possibly secondary to tomato indigestion  . Colonic polyp   . Gastritis     Antral  . Chronic anticoagulation   . Cardiomyopathy, dilated, nonischemic     Past Surgical History    Procedure Laterality Date  . Inguinal hernia repair  06/2005    Right  . Colonoscopy w/ polypectomy  2009    History   Social History  . Marital Status: Married    Spouse Name: N/A    Number of Children: 2  . Years of Education: N/A   Occupational History  . full time    Social History Main Topics  . Smoking status: Former Smoker -- 1.00 packs/day for 50 years    Types: Cigarettes    Quit date: 07/22/2009  . Smokeless tobacco: Never Used  . Alcohol Use: No  . Drug Use: No  . Sexual Activity: Not on file   Other Topics Concern  . Not on file   Social History Narrative  . No narrative on file     Filed Vitals:   10/30/13 1528  BP: 102/68  Pulse: 86  Height: 5\' 9"  (1.753 m)  Weight: 203 lb (92.08 kg)    PHYSICAL EXAM General: NAD HEENT: Normal. Neck: No JVD, no thyromegaly. Lungs: Clear to auscultation bilaterally with normal respiratory effort. CV: Nondisplaced PMI.  Irregular rhythm, normal S1/S2, no S3, no murmur. No pretibial or periankle edema.  No carotid bruit.   Abdomen: Soft, nontender, no hepatosplenomegaly, no distention.  Neurologic: Alert and oriented x 3.  Psych: Normal affect. Skin: Normal. Musculoskeletal: Normal range of motion, no gross deformities. Extremities: No clubbing or cyanosis.   ECG: Most recent ECG  reviewed.      ASSESSMENT AND PLAN: 1. Atrial fibrillation: Heart rate is slightly elevated at rest on current dose of metoprolol. I will increase this to 75 mg twice daily. Continue warfarin with f/u in INR clinic.  2. Non-ischemic cardiomyopathy: No symptoms or signs of heart failure, with aim to keep HR and BP well controlled. Increase metoprolol as noted above. Will obtain an echocardiogram in one month after HR is more optimally controlled to reassess LV systolic function to see if he is a candidate for additional therapies such as ICD.  3. Essential HTN: Well controlled on current therapy. His lisinopril 20 mg daily.  Dispo:  f/u 6 months.  Kate Sable, M.D., F.A.C.C.

## 2013-11-19 ENCOUNTER — Emergency Department (HOSPITAL_COMMUNITY): Payer: Medicare HMO

## 2013-11-19 ENCOUNTER — Encounter (HOSPITAL_COMMUNITY): Payer: Self-pay | Admitting: Emergency Medicine

## 2013-11-19 ENCOUNTER — Observation Stay (HOSPITAL_COMMUNITY): Payer: Medicare HMO

## 2013-11-19 ENCOUNTER — Observation Stay (HOSPITAL_COMMUNITY)
Admission: EM | Admit: 2013-11-19 | Discharge: 2013-12-12 | Disposition: E | Payer: Medicare HMO | Attending: Internal Medicine | Admitting: Internal Medicine

## 2013-11-19 DIAGNOSIS — Z87891 Personal history of nicotine dependence: Secondary | ICD-10-CM | POA: Insufficient documentation

## 2013-11-19 DIAGNOSIS — R0600 Dyspnea, unspecified: Secondary | ICD-10-CM | POA: Diagnosis not present

## 2013-11-19 DIAGNOSIS — E785 Hyperlipidemia, unspecified: Secondary | ICD-10-CM | POA: Insufficient documentation

## 2013-11-19 DIAGNOSIS — Z79899 Other long term (current) drug therapy: Secondary | ICD-10-CM | POA: Insufficient documentation

## 2013-11-19 DIAGNOSIS — R000 Tachycardia, unspecified: Secondary | ICD-10-CM | POA: Insufficient documentation

## 2013-11-19 DIAGNOSIS — R61 Generalized hyperhidrosis: Secondary | ICD-10-CM | POA: Diagnosis not present

## 2013-11-19 DIAGNOSIS — I2510 Atherosclerotic heart disease of native coronary artery without angina pectoris: Secondary | ICD-10-CM | POA: Insufficient documentation

## 2013-11-19 DIAGNOSIS — I429 Cardiomyopathy, unspecified: Secondary | ICD-10-CM | POA: Insufficient documentation

## 2013-11-19 DIAGNOSIS — R0602 Shortness of breath: Secondary | ICD-10-CM

## 2013-11-19 DIAGNOSIS — Z7901 Long term (current) use of anticoagulants: Secondary | ICD-10-CM | POA: Diagnosis not present

## 2013-11-19 DIAGNOSIS — L409 Psoriasis, unspecified: Secondary | ICD-10-CM | POA: Insufficient documentation

## 2013-11-19 DIAGNOSIS — R072 Precordial pain: Secondary | ICD-10-CM

## 2013-11-19 DIAGNOSIS — I43 Cardiomyopathy in diseases classified elsewhere: Secondary | ICD-10-CM

## 2013-11-19 DIAGNOSIS — R110 Nausea: Secondary | ICD-10-CM | POA: Insufficient documentation

## 2013-11-19 DIAGNOSIS — R079 Chest pain, unspecified: Principal | ICD-10-CM | POA: Insufficient documentation

## 2013-11-19 DIAGNOSIS — I4891 Unspecified atrial fibrillation: Secondary | ICD-10-CM | POA: Diagnosis present

## 2013-11-19 DIAGNOSIS — E039 Hypothyroidism, unspecified: Secondary | ICD-10-CM | POA: Diagnosis not present

## 2013-11-19 DIAGNOSIS — I480 Paroxysmal atrial fibrillation: Secondary | ICD-10-CM | POA: Insufficient documentation

## 2013-11-19 DIAGNOSIS — Z789 Other specified health status: Secondary | ICD-10-CM

## 2013-11-19 DIAGNOSIS — I428 Other cardiomyopathies: Secondary | ICD-10-CM

## 2013-11-19 DIAGNOSIS — I10 Essential (primary) hypertension: Secondary | ICD-10-CM | POA: Insufficient documentation

## 2013-11-19 DIAGNOSIS — I469 Cardiac arrest, cause unspecified: Secondary | ICD-10-CM | POA: Insufficient documentation

## 2013-11-19 DIAGNOSIS — I482 Chronic atrial fibrillation: Secondary | ICD-10-CM

## 2013-11-19 LAB — CBC WITH DIFFERENTIAL/PLATELET
Basophils Absolute: 0 10*3/uL (ref 0.0–0.1)
Basophils Absolute: 0 10*3/uL (ref 0.0–0.1)
Basophils Relative: 0 % (ref 0–1)
Basophils Relative: 0 % (ref 0–1)
EOS ABS: 0.1 10*3/uL (ref 0.0–0.7)
EOS PCT: 1 % (ref 0–5)
Eosinophils Absolute: 0.1 10*3/uL (ref 0.0–0.7)
Eosinophils Relative: 1 % (ref 0–5)
HCT: 36.4 % — ABNORMAL LOW (ref 39.0–52.0)
HCT: 27.7 % — ABNORMAL LOW (ref 39.0–52.0)
HEMOGLOBIN: 9 g/dL — AB (ref 13.0–17.0)
Hemoglobin: 12.7 g/dL — ABNORMAL LOW (ref 13.0–17.0)
Lymphocytes Relative: 14 % (ref 12–46)
Lymphocytes Relative: 38 % (ref 12–46)
Lymphs Abs: 1.5 10*3/uL (ref 0.7–4.0)
Lymphs Abs: 5.8 10*3/uL — ABNORMAL HIGH (ref 0.7–4.0)
MCH: 32.2 pg (ref 26.0–34.0)
MCH: 31.4 pg (ref 26.0–34.0)
MCHC: 34.9 g/dL (ref 30.0–36.0)
MCHC: 32.5 g/dL (ref 30.0–36.0)
MCV: 92.4 fL (ref 78.0–100.0)
MCV: 96.5 fL (ref 78.0–100.0)
MONO ABS: 1.3 10*3/uL — AB (ref 0.1–1.0)
MONOS PCT: 8 % (ref 3–12)
Monocytes Absolute: 0.6 10*3/uL (ref 0.1–1.0)
Monocytes Relative: 5 % (ref 3–12)
NEUTROS ABS: 8 10*3/uL — AB (ref 1.7–7.7)
NEUTROS PCT: 53 % (ref 43–77)
Neutro Abs: 8.5 10*3/uL — ABNORMAL HIGH (ref 1.7–7.7)
Neutrophils Relative %: 80 % — ABNORMAL HIGH (ref 43–77)
PLATELETS: 191 10*3/uL (ref 150–400)
Platelets: 141 10*3/uL — ABNORMAL LOW (ref 150–400)
RBC: 3.94 MIL/uL — ABNORMAL LOW (ref 4.22–5.81)
RBC: 2.87 MIL/uL — ABNORMAL LOW (ref 4.22–5.81)
RDW: 13.9 % (ref 11.5–15.5)
RDW: 14 % (ref 11.5–15.5)
WBC: 10.7 10*3/uL — AB (ref 4.0–10.5)
WBC: 15.1 10*3/uL — ABNORMAL HIGH (ref 4.0–10.5)

## 2013-11-19 LAB — TROPONIN I: Troponin I: <0.3 ng/mL (ref <0.30)

## 2013-11-19 LAB — BASIC METABOLIC PANEL
Anion gap: 14 (ref 5–15)
BUN: 9 mg/dL (ref 6–23)
CHLORIDE: 100 meq/L (ref 96–112)
CO2: 25 mEq/L (ref 19–32)
CREATININE: 1.15 mg/dL (ref 0.50–1.35)
Calcium: 8.4 mg/dL (ref 8.4–10.5)
GFR calc Af Amer: 74 mL/min — ABNORMAL LOW (ref >90)
GFR calc non Af Amer: 64 mL/min — ABNORMAL LOW (ref >90)
Glucose, Bld: 183 mg/dL — ABNORMAL HIGH (ref 70–99)
Potassium: 3.4 mEq/L — ABNORMAL LOW (ref 3.7–5.3)
SODIUM: 139 meq/L (ref 137–147)

## 2013-11-19 LAB — COMPREHENSIVE METABOLIC PANEL
ALT: 26 U/L (ref 0–53)
ANION GAP: 25 — AB (ref 5–15)
AST: 32 U/L (ref 0–37)
Albumin: 2.3 g/dL — ABNORMAL LOW (ref 3.5–5.2)
Alkaline Phosphatase: 81 U/L (ref 39–117)
BUN: 10 mg/dL (ref 6–23)
CO2: 12 mEq/L — ABNORMAL LOW (ref 19–32)
CREATININE: 1.27 mg/dL (ref 0.50–1.35)
Calcium: 8.3 mg/dL — ABNORMAL LOW (ref 8.4–10.5)
Chloride: 105 mEq/L (ref 96–112)
GFR calc Af Amer: 66 mL/min — ABNORMAL LOW (ref >90)
GFR calc non Af Amer: 57 mL/min — ABNORMAL LOW (ref >90)
Glucose, Bld: 138 mg/dL — ABNORMAL HIGH (ref 70–99)
Potassium: 3.9 mEq/L (ref 3.7–5.3)
Sodium: 142 mEq/L (ref 137–147)
TOTAL PROTEIN: 5.4 g/dL — AB (ref 6.0–8.3)
Total Bilirubin: 0.6 mg/dL (ref 0.3–1.2)

## 2013-11-19 LAB — PROTIME-INR
INR: 3.13 — ABNORMAL HIGH (ref 0.00–1.49)
PROTHROMBIN TIME: 32.5 s — AB (ref 11.6–15.2)

## 2013-11-19 MED ORDER — ONDANSETRON HCL 4 MG PO TABS: 4.0000 mg | ORAL_TABLET | Freq: Four times a day (QID) | ORAL | Status: DC | PRN

## 2013-11-19 MED ORDER — LISINOPRIL 10 MG PO TABS: 20.0000 mg | ORAL_TABLET | Freq: Every day | ORAL | Status: DC

## 2013-11-19 MED ORDER — METOPROLOL TARTRATE 50 MG PO TABS: 75.0000 mg | ORAL_TABLET | Freq: Two times a day (BID) | ORAL | Status: DC

## 2013-11-19 MED ORDER — ROCURONIUM BROMIDE 50 MG/5ML IV SOLN
INTRAVENOUS | Status: AC
  Filled 2013-11-19: qty 2

## 2013-11-19 MED ORDER — LIDOCAINE HCL (CARDIAC) 20 MG/ML IV SOLN
INTRAVENOUS | Status: AC
  Filled 2013-11-19: qty 5

## 2013-11-19 MED ORDER — ETOMIDATE 2 MG/ML IV SOLN
INTRAVENOUS | Status: AC
  Filled 2013-11-19: qty 20

## 2013-11-19 MED ORDER — SUCCINYLCHOLINE CHLORIDE 20 MG/ML IJ SOLN
INTRAMUSCULAR | Status: AC
  Filled 2013-11-19: qty 1

## 2013-11-19 MED ORDER — MORPHINE SULFATE 2 MG/ML IJ SOLN: 2.0000 mg | INTRAMUSCULAR | Status: DC | PRN

## 2013-11-19 MED ORDER — WARFARIN SODIUM 5 MG PO TABS: 2.5000 mg | ORAL_TABLET | Freq: Every day | ORAL | Status: DC

## 2013-11-19 MED ORDER — WARFARIN - PHARMACIST DOSING INPATIENT: Status: DC

## 2013-11-19 MED ORDER — PRAVASTATIN SODIUM 40 MG PO TABS
40.0000 mg | ORAL_TABLET | Freq: Every day | ORAL | Status: DC
  Filled 2013-11-19 (×3): qty 1

## 2013-11-19 MED ORDER — SODIUM CHLORIDE 0.9 % IJ SOLN: 3.0000 mL | Freq: Two times a day (BID) | INTRAMUSCULAR | Status: DC

## 2013-11-19 MED ORDER — ONDANSETRON HCL 4 MG/2ML IJ SOLN: 4.0000 mg | Freq: Four times a day (QID) | INTRAMUSCULAR | Status: DC | PRN

## 2013-11-20 LAB — TSH: TSH: 4.66 u[IU]/mL — AB (ref 0.350–4.500)

## 2013-11-20 MED FILL — Medication: Qty: 1 | Status: AC

## 2013-11-20 NOTE — ED Provider Notes (Signed)
I was called to the floor for a CODE BLUE. Patient had reportedly complaining of shortness of breath and then went to a PEA arrest. Upon my arrival Dr. Shanon Brow was present and was running the code and respiratory was attempting to intubate the patient. They were unable and I was able to intubate with some difficulty. Required initial Macintosh blade then glidescope then back to [name]. Patient  Had return of vitals but then lost vitals. Discussed with critical care about transfer to [facility]. Repeat EKG was done and showed some inferior ischemia but no ST elevation. Patient had loss of vitals and was not able to  Get them back.  Cardiopulmonary Resuscitation (CPR) Procedure Note Directed/Performed by: [name] Hunter I personally directed ancillary staff and/or performed CPR in an effort to regain return of spontaneous circulation and to maintain cardiac, neuro and systemic perfusion.    INTUBATION Performed by: [name] Hunter   Time out: no timeout due to urgent need for intervention Indications: CPR  Intubation method:Glidescope Laryngoscopy   Preoxygenation: BVM  Sedatives: none Paralytic: none  Tube Size: 7.0 cuffed  Post-procedure assessment: chest rise and ETCO2 monitor Breath sounds: equal and absent over the epigastrium Tube secured with: ETT holder Chest x-ray interpreted by radiologist and me.  Chest x-ray findings: endotracheal tube in appropriate position  Patient tolerated the procedure well with no immediate complications.  Difficult intubation. Had had some mild bleeding after initial respiratory attempt. Also had some foaming in the airway. Very anterior cords. Initially went with MacIntosh blade then glide scope then had intubation with MacIntosh blade.   [name] Hunter. [name] Chapel, MD 11/20/13 [phone]

## 2013-11-21 NOTE — Discharge Summary (Signed)
Death Summary  [name] STAILEY Sr. URK:[id number] DOB: 04-07-1945 DOA: 2013-12-16  PCP: Glo Herring., MD   Admit date: 12-16-13 Date of Death: 2013/12/16  Final Diagnoses:  Active Problems:   Chest pain   Tachycardia induced cardiomyopathy   Hypothyroidism   Hypertension   Atrial fibrillation   Chronic anticoagulation   Nonischemic cardiomyopathy    1. Probable acute myocardial ischemia/infarction, resulting in cardiorespiratory arrest. 2. 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 3. Chronic atrial fibrillation on chronic and cannulation therapy.  History of present illness:  This unfortunate 68 year old man presented to the hospital with two-week history of intermittent chest pain. Please see initial history as described below: This is a 68 year old man who presents with a two-week history of intermittent chest pain. The pain is central chest, does not radiate, has been associated with sweating, especially today. He says he has experienced chest pain all day today, waxing and waning. It is not related to exertion. Over the last 2 weeks he has also had same chest pain almost every other day. 2 weeks ago he had notice that he was going to lose his house. He has a history of hypertension, hyperlipidemia and non-ischemic cardiomyopathy. He did have a cardiac catheterization in 2005 which showed nonsignificant coronary artery disease. Ejection fraction was normal at that time. He has a history of chronic atrial fibrillation on chronic warfarin therapy. He is now being admitted for further evaluation Hospital Course:  Unfortunately, soon after admission and arrival to the floor, he became dyspneic, diaphoretic and hypotensive. I ordered the nurse to obtain a stat ECG and give a bolus of normal saline. Unfortunately he quickly went into cardiorespiratory arrest, the underlying rhythm seemed to be PEA. Resuscitation and intubation and ventilation  was begun and initially was successful. He was transferred to the intensive care unit where he coded 3 further times. Unfortunately on the fourth code, he did not respond to treatment with CPR, medications and of course oxygen through a bag attached to the endotracheal tube.An  epinephrine and dopamine drip had been started, and despite this blood pressure was not sufficient and there was no cardiac output. Therefore, resuscitation attempts were stopped and the patient was pronounced dead at 9:19 PM on 16-Dec-2013. An electrocardiogram initially did not show any ischemia, nor did the second one, however a third electro cardiogram was consistent with inferior ischemia and this may have been the cause of his problems. I did not feel that a postmortem was required in view of his thoroughly documented cardiac history with a nonischemic cardio myopathy and other multiple medical problems and also a documented history of nonsignificant coronary artery disease on a cardiac catheterization in 2005. He has not had repeat cardiac catheterization from what I could see of the records.   Time: 30 minutes.  SignedDoree Albee  [facility] 11/21/2013, 8:42 AM

## 2013-11-23 NOTE — Care Management Utilization Note (Signed)
UR completed 

## 2013-11-30 ENCOUNTER — Other Ambulatory Visit (HOSPITAL_COMMUNITY): Payer: Medicare HMO

## 2013-12-12 NOTE — Progress Notes (Signed)
ANTICOAGULATION CONSULT NOTE - Initial Consult  Pharmacy Consult for Coumadin (chronic Rx PTA) Indication: atrial fibrillation  No Known Allergies  Patient Measurements: Height: 5\' 9"  (175.3 cm) Weight: 180 lb (81.647 kg) IBW/kg (Calculated) : 70.7  Vital Signs: Temp: 97.5 F (36.4 C) (11/08 1554) Temp Source: Oral (11/08 1554) BP: 139/80 mmHg (11/08 1904) Pulse Rate: 56 (11/08 1904)  Labs:  Recent Labs  04-Dec-2013 1602  HGB 12.7*  HCT 36.4*  PLT 191  LABPROT 32.5*  INR 3.13*  CREATININE 1.15  TROPONINI <0.30    Estimated Creatinine Clearance: 62.3 mL/min (by C-G formula based on Cr of 1.15).   Medical History: Past Medical History  Diagnosis Date  . Paroxysmal atrial fibrillation 2007    Initially presented with atrial flutter for which RFA was performed in 2007; nl LV function in 2006; EF of 20% in 2011; no sig. coronary disease; +CHF;  DC cardioversion-> EF of 55%  . Hypertension   . Tobacco abuse, in remission     50 pack years; quit in 2011  . Psoriasis   . Angioedema     possibly secondary to tomato indigestion  . Colonic polyp   . Gastritis     Antral  . Chronic anticoagulation   . Cardiomyopathy, dilated, nonischemic     Medications:   (Not in a hospital admission)  Assessment: 68yo male on chronic Coumadin PTA for h/o afib.  Home dose reported as 2.5mg  daily.  INR is supratherapeutic on admission.    Goal of Therapy:  INR 2-3 Monitor platelets by anticoagulation protocol: Yes   Plan:  HOLD coumadin tonight INR daily  Hart Robinsons A 2013/12/04,7:22 PM

## 2013-12-12 NOTE — Progress Notes (Addendum)
CODE BLUE I was called by nursing staff approximately 1 hour ago stating that the patient had become suddenly dyspneic and was hypotensive with a systolic blood pressure of around 90 . I had ordered an ECG which was reported as showing ST elevation MI but my interpretation was that it was not ST elevation MI. I had instructed the nursing staff to move him to the stepdown unit and also given bolus of normal saline. Unfortunately the patient had a cardiorespiratory arrest soon after on the floor and advance life support was undertaken including CPR,intubation and ventilation and administration of medications. The underlying rhythm appeared to be PEA AND/OR BRADYCARDIA. He was primarily given intravenous epinephrine together with CPR. At no time was there a shockable rhythm. Resuscitation was successful initially and the patient was transported to the intensive care unit. Unfortunately he again coded 3 further times. Each time the rhythm seemed to be bradycardia when there was a pulse. He was hypotensive. Epinephrine and dopamine drip were started. On the fourth attempted  resuscitation there was no pulse. At this time resuscitation was stopped.From the initial cardiorespiratory arrest to the time of death, approximately 1 hour had elapsed. Patient was pronounced dead at 9:19 PM. Family were informed.  Addendum:Review of the last ECG done is consistent with inferior ischemia ,although previous ECGs did not show these  changes.

## 2013-12-12 NOTE — ED Provider Notes (Signed)
CSN: [id number]     Arrival date & time 12/18/2013  1543 History  This chart was scribed for [employer]. Alvino Chapel, MD by Randa Evens, ED Scribe. This patient was seen in room APA11/APA11 and the patient's care was started at 4:04 PM.    Chief Complaint  Patient presents with  . Chest Pain   Patient is a 68 y.o. male presenting with chest pain. The history is provided by the patient. No language interpreter was used.  Chest Pain Associated symptoms: cough, diaphoresis, nausea and shortness of breath   Associated symptoms: no fever    HPI Comments: [name] JOYNT Sr. is a 68 y.o. male with PMHx paroxysmal atrial fibrillation, HTN, and cardiomyopathy brought in by ambulance, who presents to the Emergency Department complaining of new sudden sharp left sided chest pain onset PTA. He states he has associated nausea, SOB, and diaphoresis, and cough. He states the onset of pain began suddenly when he was watching TV. Denies injury or trauma. Denies fever. He states that he is compliant in taking all his medications but there was a recent increase in his metoprolol dose 2 weeks ago.    Past Medical History  Diagnosis Date  . Paroxysmal atrial fibrillation 2007    Initially presented with atrial flutter for which RFA was performed in 2007; nl LV function in 2006; EF of 20% in 2011; no sig. coronary disease; +CHF;  DC cardioversion-> EF of 55%  . Hypertension   . Tobacco abuse, in remission     50 pack years; quit in 2011  . Psoriasis   . Angioedema     possibly secondary to tomato indigestion  . Colonic polyp   . Gastritis     Antral  . Chronic anticoagulation   . Cardiomyopathy, dilated, nonischemic    Past Surgical History  Procedure Laterality Date  . Inguinal hernia repair  06/2005    Right  . Colonoscopy w/ polypectomy  2009   Family History  Problem Relation Age of Onset  . Arrhythmia Father   . Arrhythmia Brother    History  Substance Use Topics  . Smoking status: Former  Smoker -- 1.00 packs/day for 50 years    Types: Cigarettes    Quit date: 07/22/2009  . Smokeless tobacco: Never Used  . Alcohol Use: No    Review of Systems  Constitutional: Positive for diaphoresis. Negative for fever.  Respiratory: Positive for cough and shortness of breath.   Cardiovascular: Positive for chest pain.  Gastrointestinal: Positive for nausea.     Allergies  Review of patient's allergies indicates no known allergies.  Home Medications   Prior to Admission medications   Medication Sig Start Date End Date Taking? Authorizing Provider  ibuprofen (ADVIL,MOTRIN) 200 MG tablet Take 400 mg by mouth every 6 (six) hours as needed for headache.   Yes Historical Provider, MD  lisinopril (PRINIVIL,ZESTRIL) 20 MG tablet Take 1 tablet (20 mg total) by mouth daily. 09/19/13  Yes Herminio Commons, MD  metoprolol (LOPRESSOR) 50 MG tablet Take 1.5 tablets (75 mg total) by mouth 2 (two) times daily. 10/30/13  Yes Herminio Commons, MD  pravastatin (PRAVACHOL) 40 MG tablet Take 1 tablet (40 mg total) by mouth daily. 10/23/13  Yes Herminio Commons, MD  warfarin (COUMADIN) 2.5 MG tablet Take 2.5 mg by mouth daily.   Yes Historical Provider, MD  warfarin (COUMADIN) 2.5 MG tablet 1 tablet daily or as directed Patient not taking: Reported on 12-18-2013 07/11/13   Jamesetta So  Blanchard Mane, MD   Triage Vitals: BP 103/55 mmHg  Pulse 47  Temp(Src) 97.5 F (36.4 C) (Oral)  Resp 21  Ht 5\' 9"  (1.753 m)  Wt 180 lb (81.647 kg)  BMI 26.57 kg/m2  SpO2 95%  Physical Exam  Constitutional: He is oriented to person, place, and time. He appears well-developed and well-nourished.  HENT:  Head: Normocephalic and atraumatic.  Eyes: Conjunctivae and EOM are normal.  Cardiovascular: Normal rate.  An irregular rhythm present.  No murmur heard. Pulmonary/Chest: Effort normal.  Abdominal: Soft. Bowel sounds are normal. There is no tenderness.  Musculoskeletal: He exhibits no edema.  Neurological: He  is alert and oriented to person, place, and time.  Psychiatric: He has a normal mood and affect. His behavior is normal.  Nursing note and vitals reviewed.   ED Course  Procedures (including critical care time) Labs Review Labs Reviewed  CBC WITH DIFFERENTIAL - Abnormal; Notable for the following:    WBC 10.7 (*)    RBC 3.94 (*)    Hemoglobin 12.7 (*)    HCT 36.4 (*)    Neutrophils Relative % 80 (*)    Neutro Abs 8.5 (*)    All other components within normal limits  BASIC METABOLIC PANEL - Abnormal; Notable for the following:    Potassium 3.4 (*)    Glucose, Bld 183 (*)    GFR calc non Af Amer 64 (*)    GFR calc Af Amer 74 (*)    All other components within normal limits  PROTIME-INR - Abnormal; Notable for the following:    Prothrombin Time 32.5 (*)    INR 3.13 (*)    All other components within normal limits  TROPONIN I    Imaging Review Dg Chest 2 View  16-Dec-2013   CLINICAL DATA:  Pt reports sudden onset of sharp left sided chest pain with nausea, SOB and diaphoresis. Pt alert and oriented. ASA PTA, 2 nitro given en route. Pt reports relief of symptoms. Pain scale 2-3/10.  EXAM: CHEST  2 VIEW  COMPARISON:  12/18/2010  FINDINGS: Cardiac silhouette is mildly enlarged. No mediastinal or hilar masses. No evidence of adenopathy.  Lungs are hyperexpanded. Mild hazy opacity at posterior lung bases is likely atelectasis. No lung consolidation or edema. No pneumothorax.  Bony thorax is demineralized but intact.  IMPRESSION: No acute cardiopulmonary disease.   Electronically Signed   By: Lajean Manes M.D.   On: 12-16-13 16:57     EKG Interpretation   Date/Time:  2013-12-16 15:50:33 EST Ventricular Rate:  58 PR Interval:    QRS Duration: 159 QT Interval:  537 QTC Calculation: 527 R Axis:   68 Text Interpretation:  Atrial fibrillation Right bundle branch block T  waves more prominant than previous Confirmed by Charley Lafrance  MD, Shanora Christensen  [phone]) on Dec 16, 2013 3:54:05  PM      MDM   Final diagnoses:  Chest pain, unspecified chest pain type    Patient with chest pain. EKG nonspecific. Has known cardiomyopathy that is presumed to be nonischemic. Reports of no significant cardiac disease but unknown what time and how was evaluated. Will admit to internal medicine for further monitoring.   I personally performed the services described in this documentation, which was scribed in my presence. The recorded information has been reviewed and is accurate.       Jasper Riling. Alvino Chapel, MD 12/16/2013 [phone]

## 2013-12-12 NOTE — ED Notes (Signed)
Report given to floor. Pt ready for transport. VSS.

## 2013-12-12 NOTE — ED Notes (Signed)
Pt reports sudden onset of sharp left sided chest pain with nausea, SOB and diaphoresis. Pt alert and oriented. ASA PTA, 2 nitro given en route. Pt reports relief of symptoms. Pain scale 2-3/10. Pt placed on monitor. EKG completed on arrival. VSS.

## 2013-12-12 NOTE — Progress Notes (Signed)
Patient arrived on med surg floor around 1950 in a stable condition. Patient stated chest pain was a 2/10. Patient then started complaining of SOB and became pale and diaphoretic. He was 100% on room air. Patient urinated on self and started really complaining of shortness of breathe after being placed on 2L O2 for comfort. Respiratory called and came to assess patient. Patient started stating in the 80%. Patient became increasingly pale, restless, and fidgeting. He continuted to complain that he could not breath. Dr. Anastasio Champion notified. EKG was taken and results were STEMI. Dr. paged again. 549mL bolus was given. Before patient could be transferred to the stepdown unit, he became unresponsive. Code called and CPR started around 2020.

## 2013-12-12 NOTE — ED Notes (Signed)
Error on CXR completion.

## 2013-12-12 NOTE — Progress Notes (Signed)
Patient transferred to ICU status post cardiac arrest; patient was connected to the bedside cardiac monitor and connected to the vent; patient then had bradycardia and went pulseless, code was then called, see code sheet for details; patient regained a pulse and lost it three separate times; patient was started on Dopamine drip and Epip drip and had NS wide open as a bolus; Dr Loralee Pacas and Dr Matilde Haymaker in the room at the bedside along with the ER physician Dr Alvino Chapel; code team discussed possible causes and treatments; blood was drawn and sent to the lab; 12 lead EKG completed during time span patient regained a pulse; on third round of medications and CPR for asystole, the code was then called with a time of death of 05/09/17 by Dr Matilde Haymaker; asystole confirmed on the monitor and strips printed; next of kin notified by the house supervisor; post mortem care completed; [facility] called referral number received awaiting a callback for possible donation; family in with information received for funeral home and updated telephone numbers; will await call from [facility] with further instruction

## 2013-12-12 NOTE — H&P (Signed)
[facility] History and Physical  [name] Hunter Sr. OHY:[id number] DOB: [birth year] DOA: 11-Dec-2013  Referring physician: ER PCP: Glo Herring., MD   Chief Complaint: chest pain  HPI: [name] Hunter Sr. is a 68 y.o. male  This is a 68 year old man who presents with a two-week history of intermittent chest pain. The pain is central chest, does not radiate, has been associated with sweating, especially today. He says he has experienced chest pain all day today, waxing and waning. It is not related to exertion. Over the last 2 weeks he has also had same chest pain almost every other day. 2 weeks ago he had notice that he was going to lose his house. He has a history of hypertension, hyperlipidemia and non-ischemic cardiomyopathy. He did have a cardiac catheterization in 2005 which showed nonsignificant coronary artery disease. Ejection fraction was normal at that time. He has a history of chronic atrial fibrillation on chronic warfarin therapy. He is now being admitted for further evaluation.   Review of Systems:  Constitutional:  No weight loss, night sweats, Fevers, chills, fatigue.  HEENT:  No headaches, Difficulty swallowing,Tooth/dental problems,Sore throat,  No sneezing, itching, ear ache, nasal congestion, post nasal drip,   GI:  No heartburn, indigestion, abdominal pain, nausea, vomiting, diarrhea, change in bowel habits, loss of appetite  Resp:  No shortness of breath with exertion or at rest. No excess mucus, no productive cough, No non-productive cough, No coughing up of blood.No change in color of mucus.No wheezing.No chest wall deformity  Skin:  no rash or lesions.  GU:  no dysuria, change in color of urine, no urgency or frequency. No flank pain.  Musculoskeletal:  No joint pain or swelling. No decreased range of motion. No back pain.  Psych:  No change in mood or affect. No depression or anxiety. No memory loss.   Past Medical History  Diagnosis Date    . Paroxysmal atrial fibrillation 2007    Initially presented with atrial flutter for which RFA was performed in 2007; nl LV function in 2006; EF of 20% in 2011; no sig. coronary disease; +CHF;  DC cardioversion-> EF of 55%  . Hypertension   . Tobacco abuse, in remission     50 pack years; quit in 2011  . Psoriasis   . Angioedema     possibly secondary to tomato indigestion  . Colonic polyp   . Gastritis     Antral  . Chronic anticoagulation   . Cardiomyopathy, dilated, nonischemic    Past Surgical History  Procedure Laterality Date  . Inguinal hernia repair  06/2005    Right  . Colonoscopy w/ polypectomy  2009   Social History:  reports that he quit smoking about 4 years ago. His smoking use included Cigarettes. He has a 50 pack-year smoking history. He has never used smokeless tobacco. He reports that he does not drink alcohol or use illicit drugs.  No Known Allergies  Family History  Problem Relation Age of Onset  . Arrhythmia Father   . Arrhythmia Brother      Prior to Admission medications   Medication Sig Start Date End Date Taking? Authorizing Provider  ibuprofen (ADVIL,MOTRIN) 200 MG tablet Take 400 mg by mouth every 6 (six) hours as needed for headache.   Yes Historical Provider, MD  lisinopril (PRINIVIL,ZESTRIL) 20 MG tablet Take 1 tablet (20 mg total) by mouth daily. 09/19/13  Yes Herminio Commons, MD  metoprolol (LOPRESSOR) 50 MG tablet Take 1.5 tablets (75 mg  total) by mouth 2 (two) times daily. 10/30/13  Yes Herminio Commons, MD  pravastatin (PRAVACHOL) 40 MG tablet Take 1 tablet (40 mg total) by mouth daily. 10/23/13  Yes Herminio Commons, MD  warfarin (COUMADIN) 2.5 MG tablet Take 2.5 mg by mouth daily.   Yes Historical Provider, MD  warfarin (COUMADIN) 2.5 MG tablet 1 tablet daily or as directed Patient not taking: Reported on 12/01/13 07/11/13   Herminio Commons, MD   Physical Exam: Filed Vitals:   12/01/2013 1721 December 01, 2013 1730 2013/12/01 1800  2013-12-01 1904  BP: 140/103 100/85 108/89 139/80  Pulse: 59 117 52 56  Temp:      TempSrc:      Resp: 18 15  17   Height:      Weight:      SpO2: 100% 99% 99% 99%    Wt Readings from Last 3 Encounters:  2013/12/01 81.647 kg (180 lb)  10/30/13 92.08 kg (203 lb)  11/17/12 98.431 kg (217 lb)    General:  Appears calm and comfortable Eyes: PERRL, normal lids, irises & conjunctiva ENT: grossly normal hearing, lips & tongue Neck: no LAD, masses or thyromegaly Cardiovascular: irregularly irregular, consistent with age fibrillation. No murmurs. No clinical signs of heart failure. Telemetry: atrial fibrillation, rate controlled. Respiratory: CTA bilaterally, no w/r/r. Normal respiratory effort. Abdomen: soft, ntnd Skin: no rash or induration seen on limited exam Musculoskeletal: grossly normal tone BUE/BLE Psychiatric: grossly normal mood and affect, speech fluent and appropriate Neurologic: grossly non-focal.          Labs on Admission:  Basic Metabolic Panel:  Recent Labs Lab Dec 01, 2013 1602  NA 139  K 3.4*  CL 100  CO2 25  GLUCOSE 183*  BUN 9  CREATININE 1.15  CALCIUM 8.4   Liver Function Tests: No results for input(s): AST, ALT, ALKPHOS, BILITOT, PROT, ALBUMIN in the last 168 hours. No results for input(s): LIPASE, AMYLASE in the last 168 hours. No results for input(s): AMMONIA in the last 168 hours. CBC:  Recent Labs Lab 2013-12-01 1602  WBC 10.7*  NEUTROABS 8.5*  HGB 12.7*  HCT 36.4*  MCV 92.4  PLT 191   Cardiac Enzymes:  Recent Labs Lab 12-01-13 1602  TROPONINI <0.30    BNP (last 3 results) No results for input(s): PROBNP in the last 8760 hours. CBG: No results for input(s): GLUCAP in the last 168 hours.  Radiological Exams on Admission: Dg Chest 2 View  12-01-13   CLINICAL DATA:  Pt reports sudden onset of sharp left sided chest pain with nausea, SOB and diaphoresis. Pt alert and oriented. ASA PTA, 2 nitro given en route. Pt reports relief of  symptoms. Pain scale 2-3/10.  EXAM: CHEST  2 VIEW  COMPARISON:  12/18/2010  FINDINGS: Cardiac silhouette is mildly enlarged. No mediastinal or hilar masses. No evidence of adenopathy.  Lungs are hyperexpanded. Mild hazy opacity at posterior lung bases is likely atelectasis. No lung consolidation or edema. No pneumothorax.  Bony thorax is demineralized but intact.  IMPRESSION: No acute cardiopulmonary disease.   Electronically Signed   By: Lajean Manes M.D.   On: Dec 01, 2013 16:57    EKG: Independently reviewed. Atrial fibrillation without any ST T-wave changes. Rate is controlled.  Assessment/Plan   1. Chest pain, possibly ischemic in origin. 2. Tachycardia-induced cardiomyopathy, previous ejection fraction 25-30%. 3. Chronic atrial fibrillation on warfarin therapy. 4. Hypertension. 5. Hyperlipidemia.  Plan: 1. Admit to telemetry. 2. Serial cardiac enzymes. 3. Cardiology consultation in the morning. 4.  Monitor and control diabetes.  Further recommendations will depend on patient's hospital progress.  Code Status: full code.  Family Communication: I discussed the plan with the patient at the bedside.   Disposition Plan: home when medically stable.  Time spent: 60 minutes.  Doree Albee [facility] Pager [phone].

## 2013-12-12 DEATH — deceased

## 2015-06-10 IMAGING — CR DG CHEST 1V
1 series · 1 of 1 positions shown · non-contrast
Comparison: Earlier today.

CLINICAL DATA: ET tube placement.

EXAM:
CHEST - 1 VIEW

[ap portable]
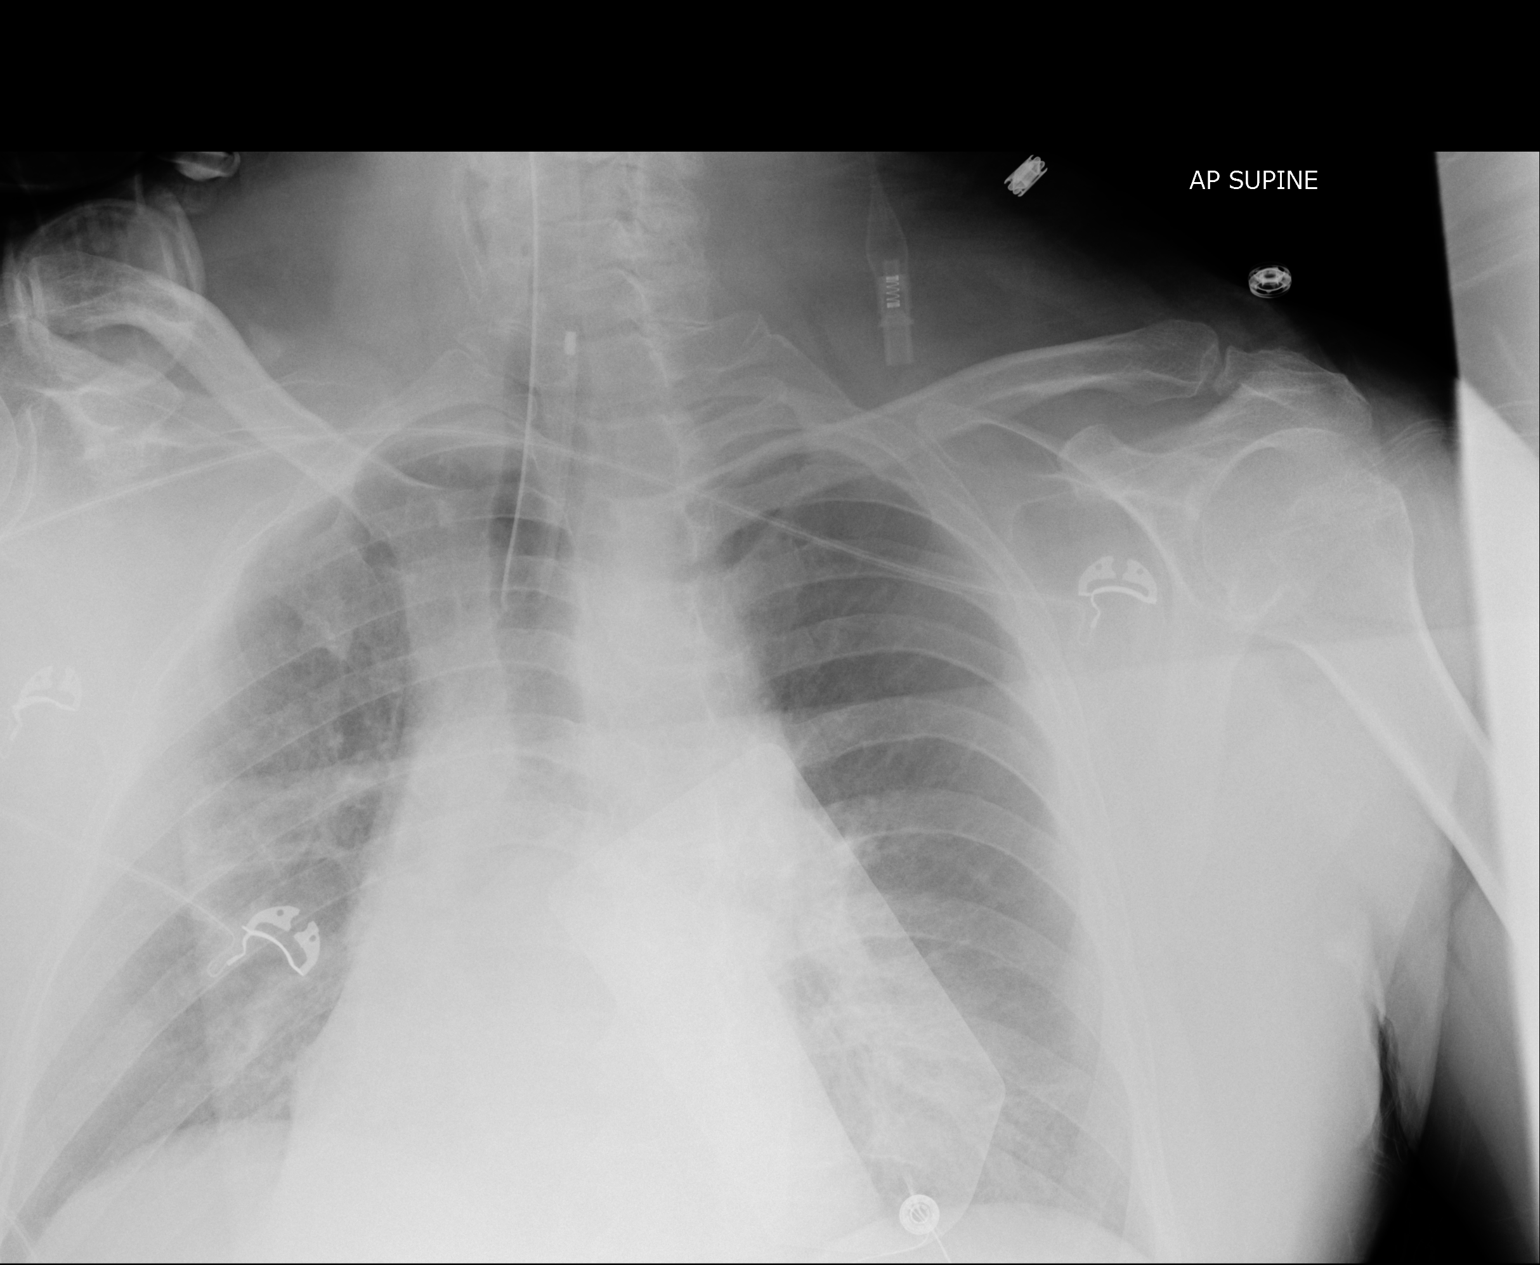

[1 of 1 positions shown; findings below may reference images not displayed]

FINDINGS: Endotracheal tube has tip 6.1 cm above the carina. External pacer
pad is noted over the left chest. Lungs are adequately inflated as
patient is rotated to the right. There is mild prominence of the
perihilar markings suggesting minimal congestion. There is no
consolidation or effusion. No evidence of pneumothorax.
Cardiomediastinal silhouette and remainder the exam is unchanged.
IMPRESSION: Possible mild vascular congestion.

Endotracheal tube with tip 6.1 cm above the carina.

## 2015-06-10 IMAGING — CR DG CHEST 2V
2 series · 2 of 2 positions shown · non-contrast
Comparison: 12/18/2010

CLINICAL DATA: Pt reports sudden onset of sharp left sided chest
pain with nausea, SOB and diaphoresis. Pt alert and oriented. ASA
PTA, 2 nitro given en route. Pt reports relief of symptoms. Pain
scale 2-3/10.

EXAM:
CHEST  2 VIEW

[view not recorded (1 of 2)]
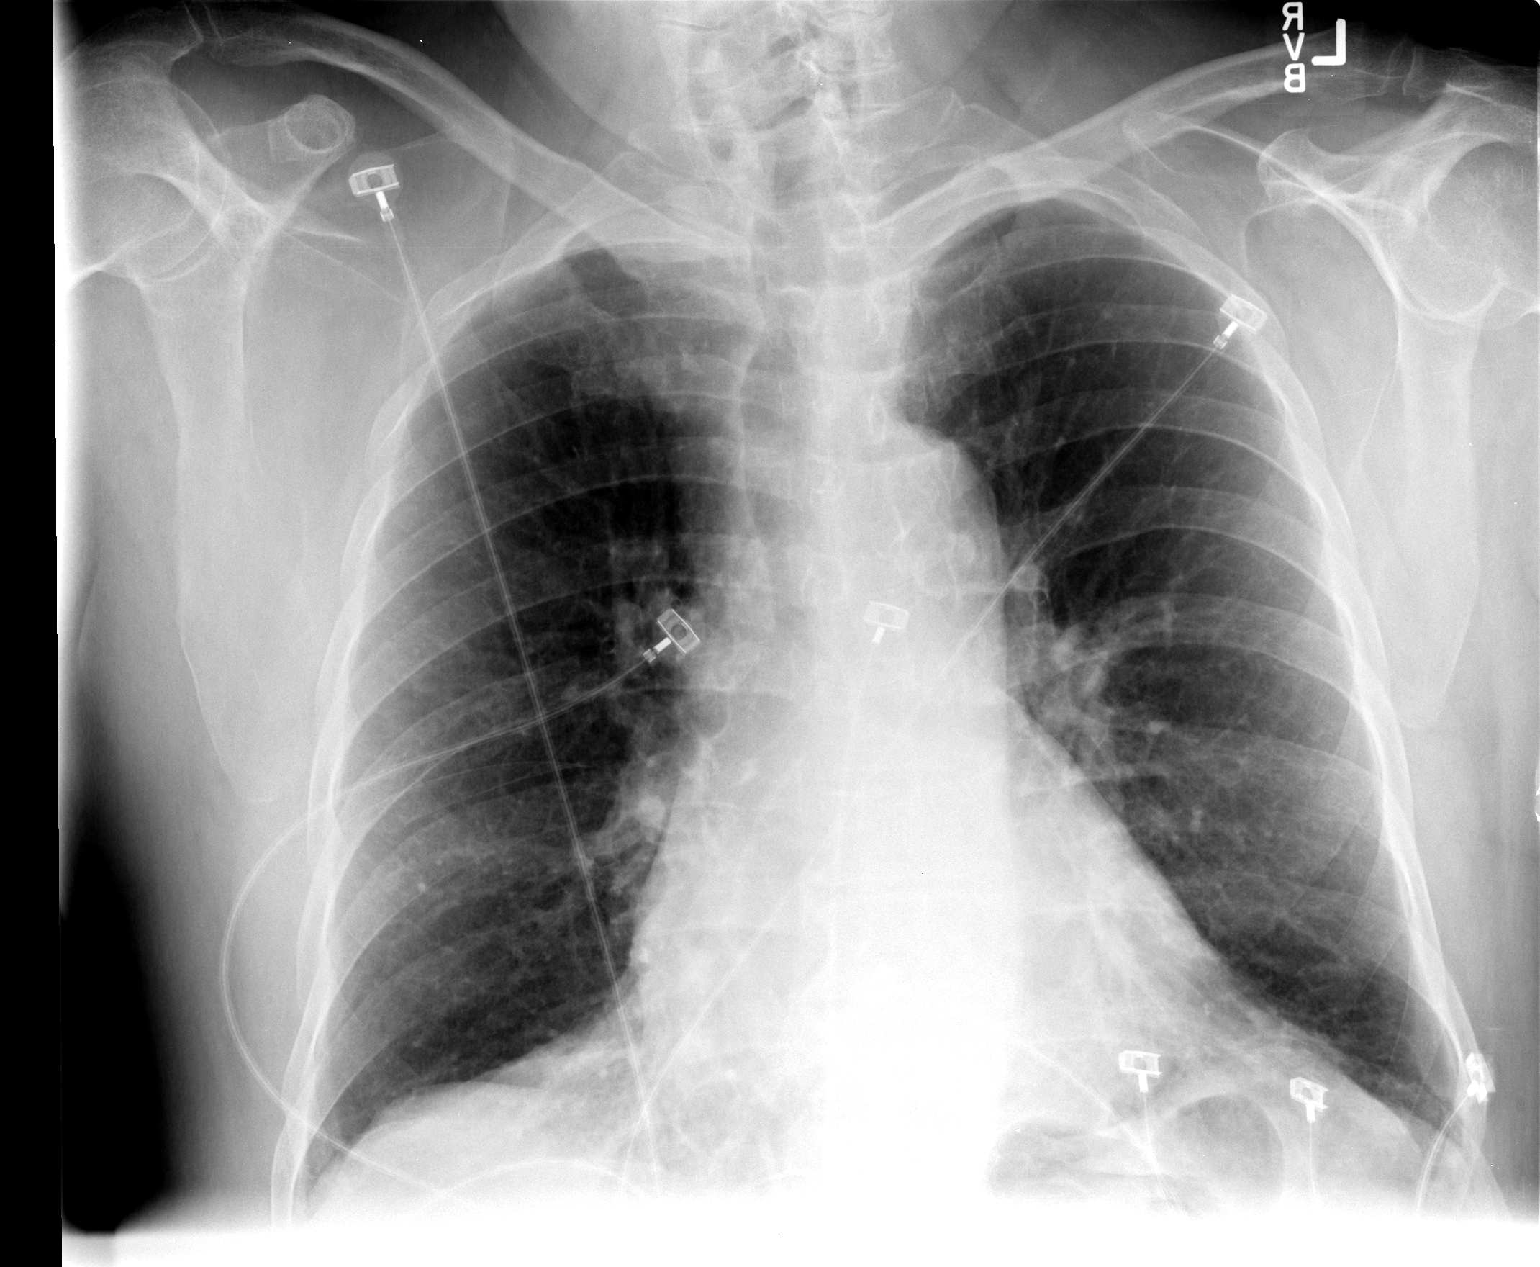

[view not recorded (2 of 2)]
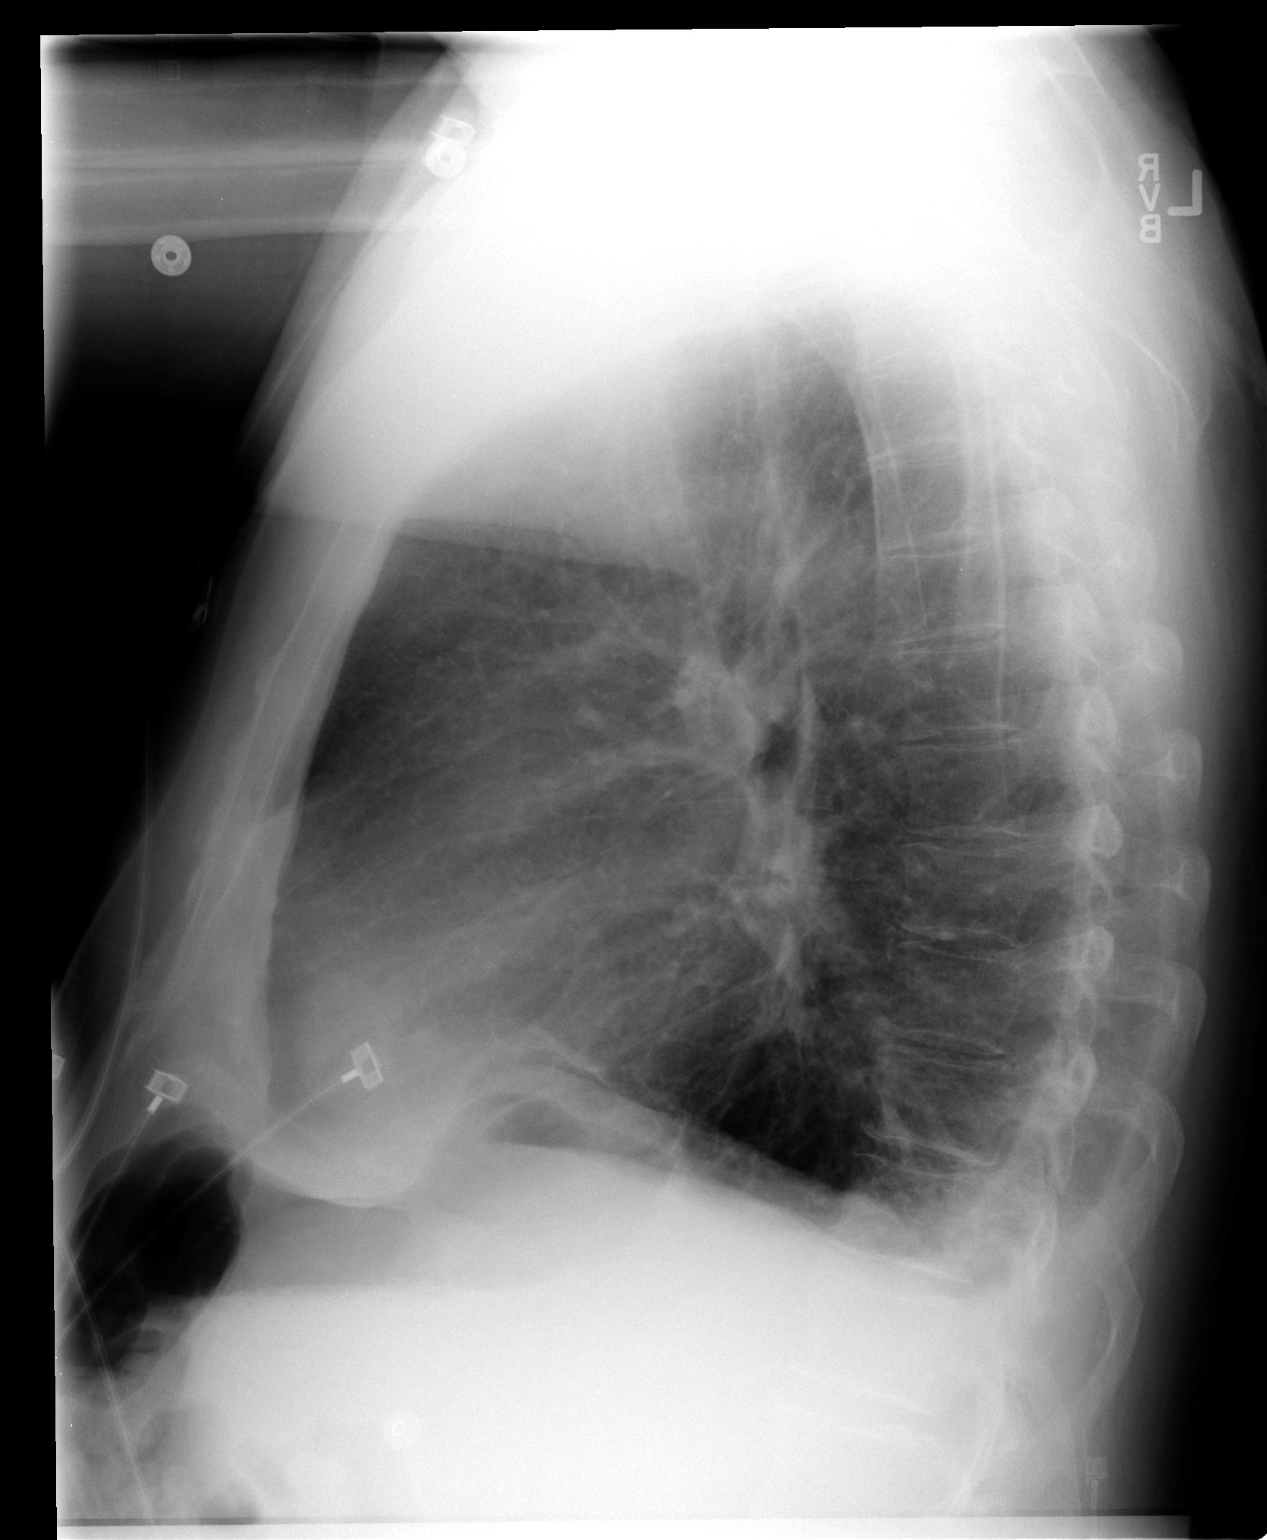

[2 of 2 positions shown; findings below may reference images not displayed]

FINDINGS: Cardiac silhouette is mildly enlarged. No mediastinal or hilar
masses. No evidence of adenopathy.

Lungs are hyperexpanded. Mild hazy opacity at posterior lung bases
is likely atelectasis. No lung consolidation or edema. No
pneumothorax.

Bony thorax is demineralized but intact.
IMPRESSION: No acute cardiopulmonary disease.
